# Patient Record
Sex: Male | Born: 1995 | Race: White | Hispanic: No | Marital: Single | Smoking: Never smoker
Health system: Southern US, Community
[De-identification: ages and names within clinical notes are randomized; demographics above are authoritative.]

## PROBLEM LIST (undated history)

## (undated) DIAGNOSIS — S43492A Other sprain of left shoulder joint, initial encounter: Secondary | ICD-10-CM

## (undated) DIAGNOSIS — M25312 Other instability, left shoulder: Secondary | ICD-10-CM

## (undated) HISTORY — PX: INGUINAL HERNIA REPAIR: SHX194

---
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
## 2015-01-11 IMAGING — CR DG SHOULDER 2+V*L*
3 series · 3 of 3 positions shown · non-contrast
Comparison: None.

CLINICAL DATA: Left shoulder pain, football injury

LEFT SHOULDER - 2+ VIEW

[view not recorded (1 of 3)]
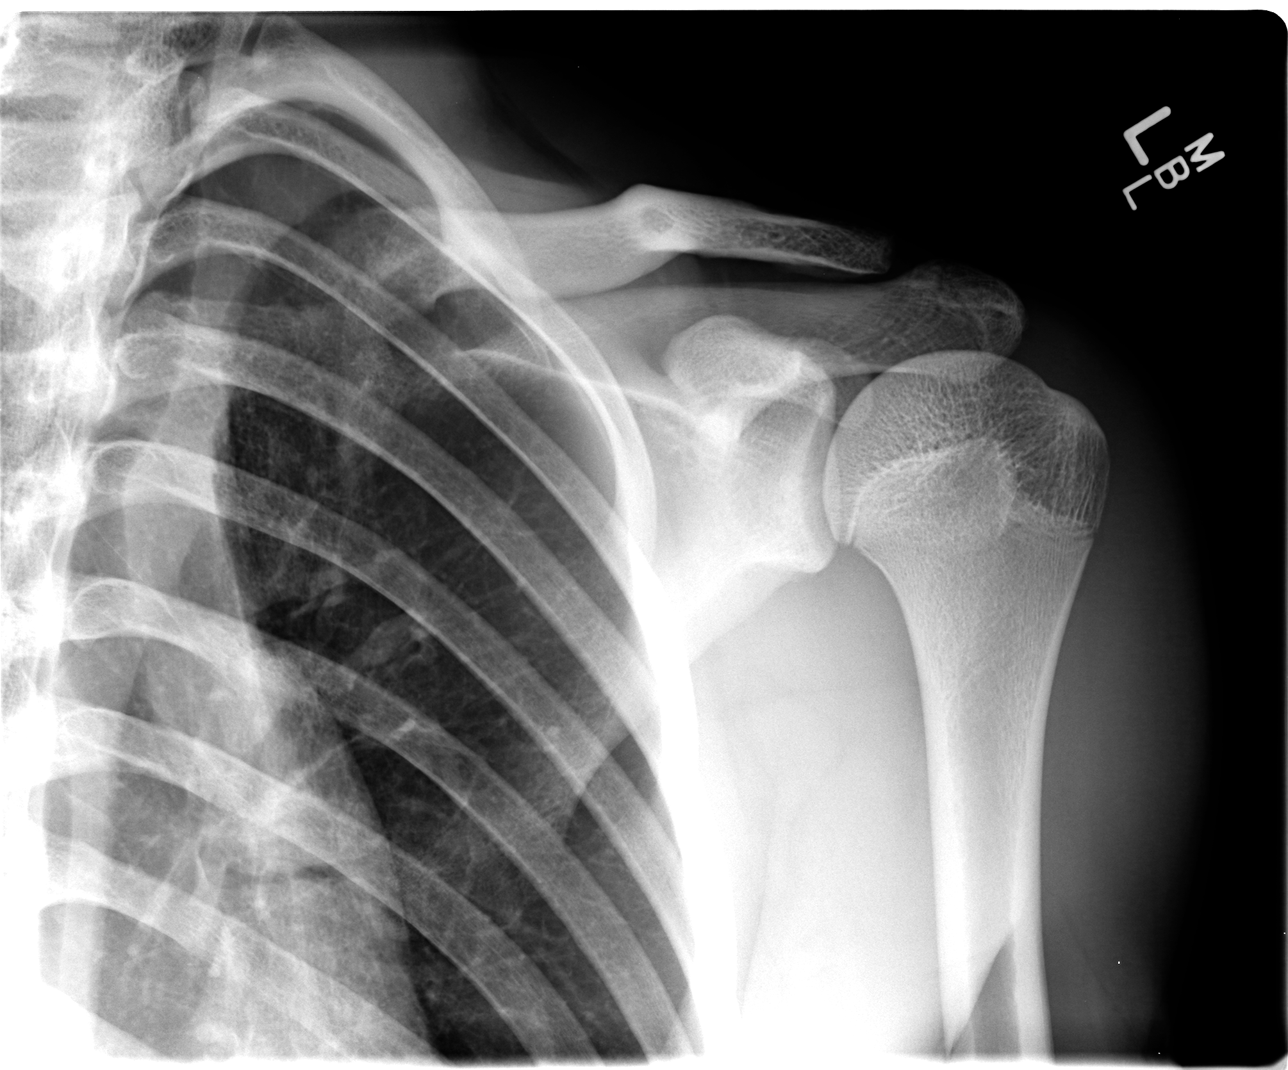

[view not recorded (2 of 3)]
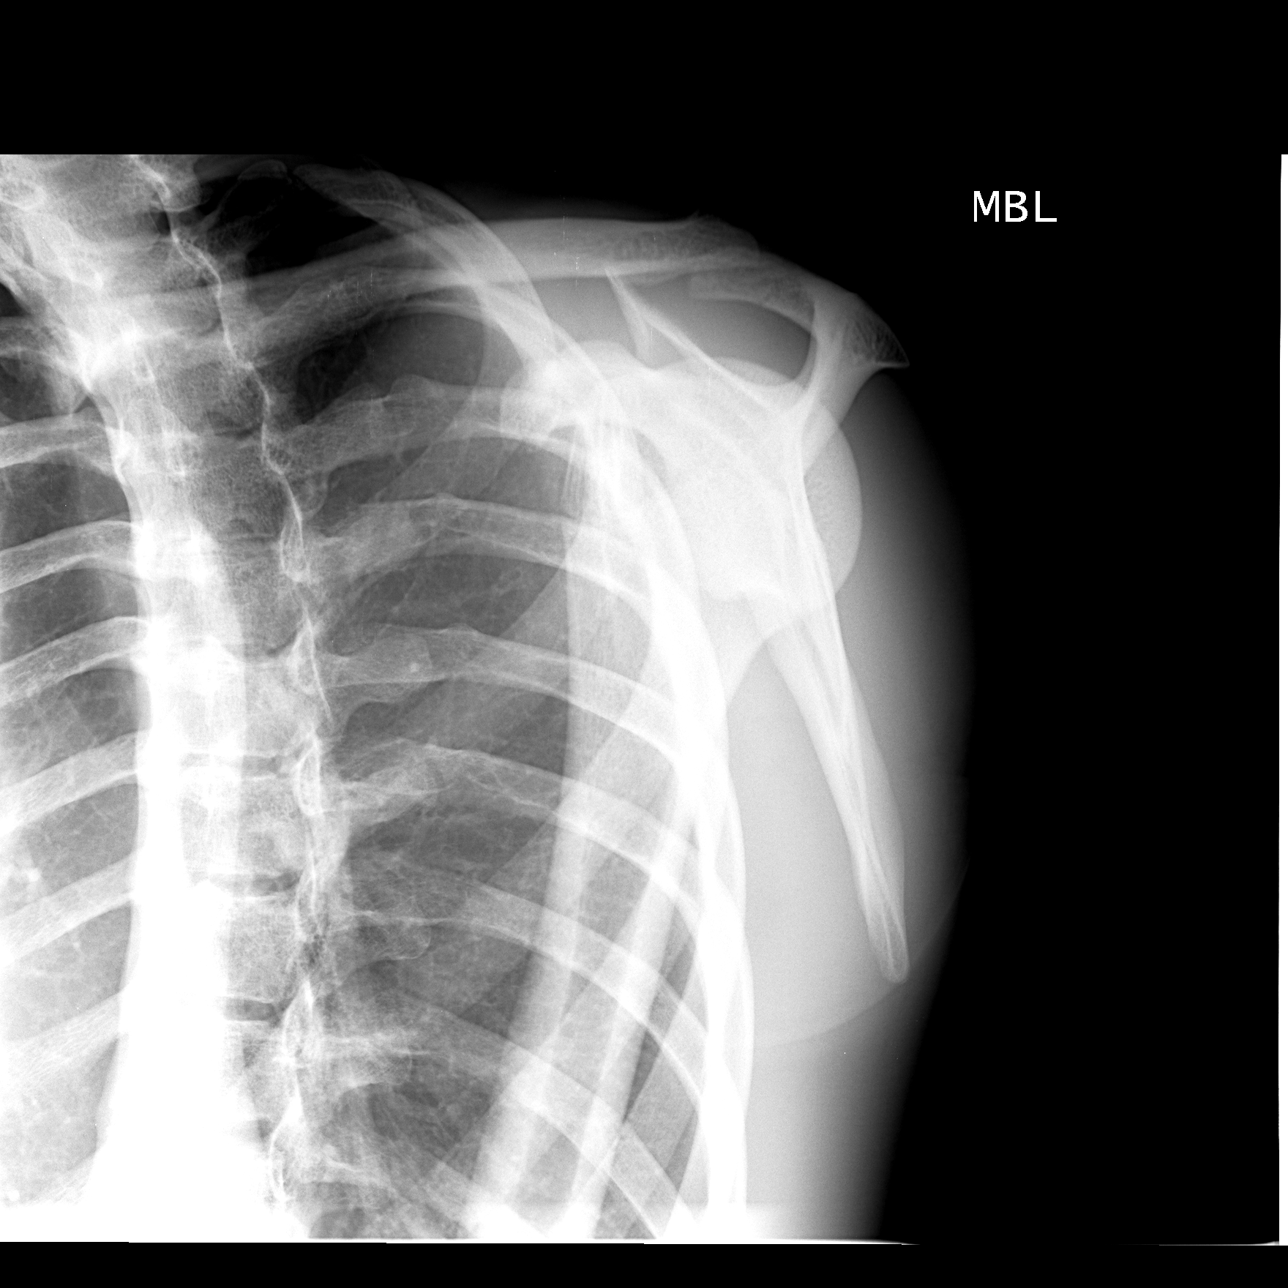

[view not recorded (3 of 3)]
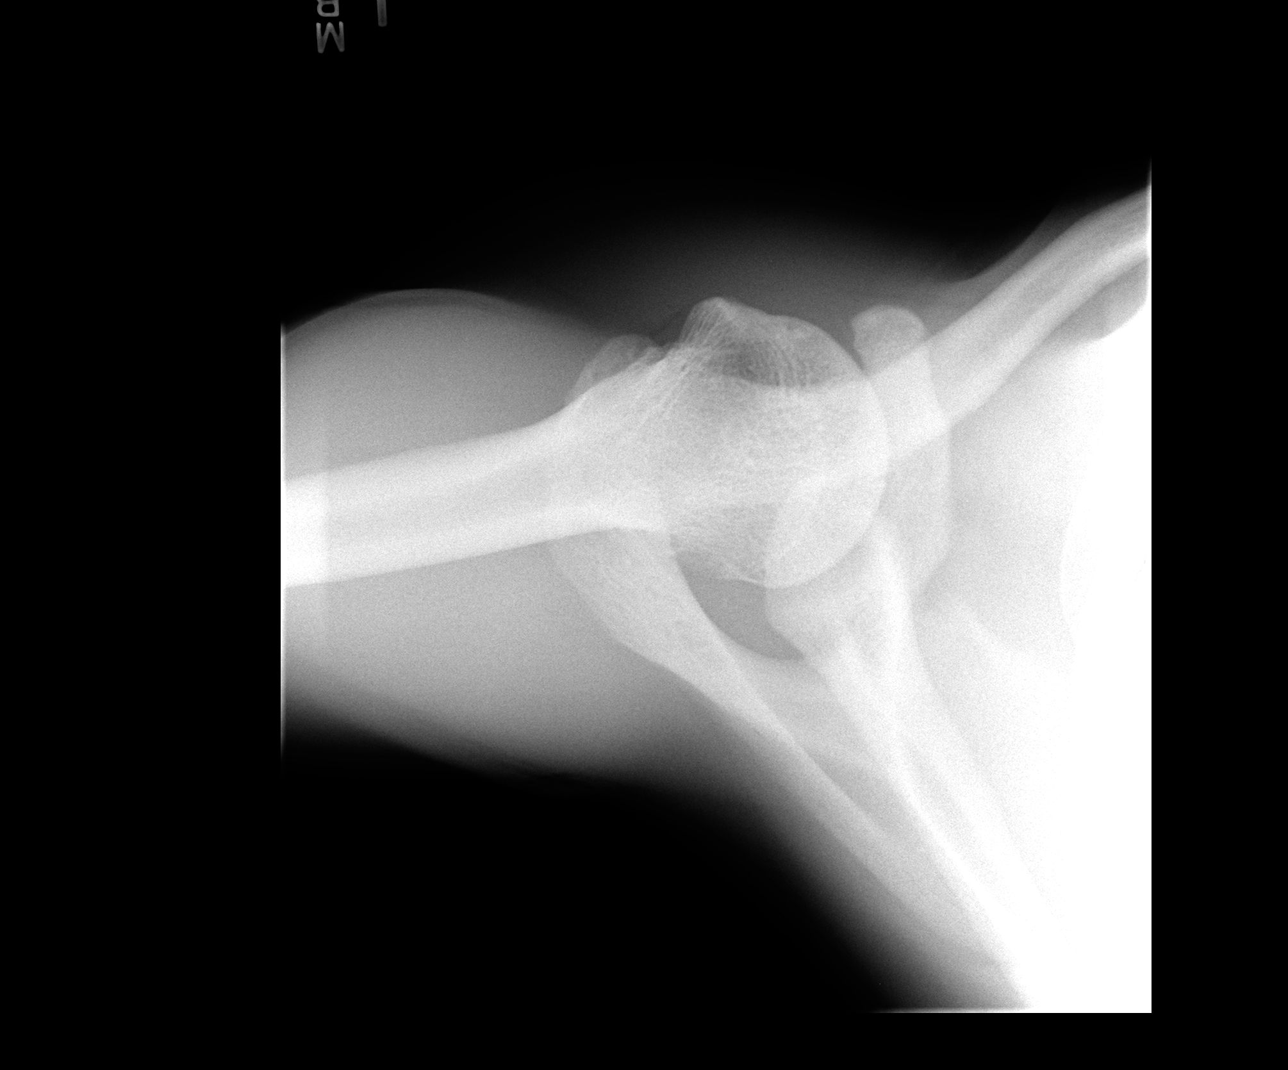

[3 of 3 positions shown; findings below may reference images not displayed]

FINDINGS: No fracture or dislocation is seen.

The joint spaces are preserved.

The visualized soft tissues are unremarkable.

Visualized left lung is clear.
IMPRESSION: No fracture or dislocation is seen.

## 2016-05-01 DIAGNOSIS — M25312 Other instability, left shoulder: Secondary | ICD-10-CM

## 2016-05-01 HISTORY — DX: Other instability, left shoulder: M25.312

## 2016-05-22 ENCOUNTER — Encounter (HOSPITAL_BASED_OUTPATIENT_CLINIC_OR_DEPARTMENT_OTHER): Payer: Self-pay | Admitting: *Deleted

## 2016-05-25 ENCOUNTER — Encounter (HOSPITAL_BASED_OUTPATIENT_CLINIC_OR_DEPARTMENT_OTHER): Payer: Self-pay | Admitting: Physician Assistant

## 2016-05-25 DIAGNOSIS — S43432A Superior glenoid labrum lesion of left shoulder, initial encounter: Secondary | ICD-10-CM

## 2016-05-25 DIAGNOSIS — S43492A Other sprain of left shoulder joint, initial encounter: Secondary | ICD-10-CM

## 2016-05-25 DIAGNOSIS — M25312 Other instability, left shoulder: Secondary | ICD-10-CM

## 2016-05-25 HISTORY — DX: Other sprain of left shoulder joint, initial encounter: S43.492A

## 2016-05-25 HISTORY — DX: Superior glenoid labrum lesion of left shoulder, initial encounter: S43.432A

## 2016-05-25 HISTORY — DX: Other instability, left shoulder: M25.312

## 2016-05-25 NOTE — H&P (Signed)
Jackson Jackson is an 21 y.o. male.   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 He tried to play college football but was unable to do so because of pain and shoulder instability. He also tried to be in the [employer] but due to his shoulder instability was unable to do this as well. He underwent an MRI on 04-25-16 which has revealed a possible labral tear anterior, inferior and posterior. This is not an arthrogram. He continues to have pain with abduction and external rotation. He can lift weights as long as his arms are in at his side. He has tried rehab without significant relief.    Past Medical History:  Diagnosis Date  . 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 Shoulder instability, left 05/2016    Past Surgical History:  Procedure Laterality Date  . INGUINAL HERNIA REPAIR    . TONSILLECTOMY AND ADENOIDECTOMY  08/16/2007    History reviewed. No pertinent family history. Social History:  reports that he has never smoked. He has never used smokeless tobacco. He reports that he does not drink alcohol or use drugs.  Allergies:  Allergies  Allergen Reactions  . Vicodin [Hydrocodone-Acetaminophen] Nausea And Vomiting    No current facility-administered medications for this encounter.  No current outpatient prescriptions on file.  No results found for this or any previous visit (from the past 48 hour(s)). No results found.  Review of Systems  Constitutional: Negative.   HENT: Negative.   Eyes: Negative.   Respiratory: Negative.   Cardiovascular: Negative.   Gastrointestinal: Negative.   Genitourinary: Negative.   Musculoskeletal: Positive for joint pain.  Skin: Negative.   Neurological: Negative.   Endo/Heme/Allergies: Negative.   Psychiatric/Behavioral: Negative.     Height 6\' 2"  (1.88  m), weight 90.7 kg (200 lb). Physical Exam  Constitutional: He is oriented to person, place, and time. He appears well-developed and well-nourished.  HENT:  Head: Normocephalic and atraumatic.  Mouth/Throat: Oropharynx is clear and moist.  Eyes: Conjunctivae are normal. Pupils are equal, round, and reactive to light.  Neck: Neck supple.  Cardiovascular: Normal rate.   Respiratory: Effort normal.  GI: Soft.  Musculoskeletal:  Examination of his left shoulder reveals a full range of motion with pain. There is pain on abduction and external rotation with positive apprehension. Rotator cuff strength is intact. Examination of his right shoulder reveals a full range of motion without pain, weakness or instability. Vascular exam: pulses 2+ and symmetric. Neurologic exam, distal motor and sensory examinations within normal limits.   Neurological: He is alert and oriented to person, place, and time.  Skin: Skin is warm and dry.  Psychiatric: He has a normal mood and affect. His behavior is normal.     Assessment Principal Problem:   Bankart lesion of left shoulder Active Problems:   Instability of left shoulder joint    Plan I talked to him about this in detail. We would recommend with the findings of a significant recurring instability and his lack of response to conservative care that we proceed with a left shoulder arthroscopy with labral debridement versus repair with Bankart repair. Possible anterior and posterior repair. The risks, complications and benefits of the surgery have been described to him in detail and he understands this completely . We will plan on  setting him up for this  at some point in the near future.   Pascal LuxSHEPPERSON,Caliegh Middlekauff J, PA-C 05/25/2016, 3:03 PM

## 2016-05-28 ENCOUNTER — Ambulatory Visit (HOSPITAL_BASED_OUTPATIENT_CLINIC_OR_DEPARTMENT_OTHER)
Admission: RE | Admit: 2016-05-28 | Payer: Managed Care, Other (non HMO) | Source: Ambulatory Visit | Admitting: Orthopedic Surgery

## 2016-05-28 HISTORY — DX: Other sprain of left shoulder joint, initial encounter: S43.492A

## 2016-05-28 HISTORY — DX: Other instability, left shoulder: M25.312

## 2016-05-28 SURGERY — SHOULDER ARTHROSCOPY WITH BANKART REPAIR
Anesthesia: General | Laterality: Left

## 2016-07-03 ENCOUNTER — Encounter (HOSPITAL_BASED_OUTPATIENT_CLINIC_OR_DEPARTMENT_OTHER): Payer: Self-pay | Admitting: *Deleted

## 2016-07-03 NOTE — H&P (Signed)
Jackson Jackson is an 21 y.o. male.   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 He tried to play college football but was unable to do so because of pain and shoulder instability. He also tried to be in the [employer] but due to his shoulder instability was unable to do this as well. He underwent an MRI on 04-25-16 which has revealed a possible labral tear anterior, inferior and posterior. This is not an arthrogram. He continues to have pain with abduction and external rotation. He can lift weights as long as his arms are in at his side. He has tried rehab without significant relief.    Past Medical History:  Diagnosis Date  . 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 Shoulder instability, left 05/2016    Past Surgical History:  Procedure Laterality Date  . INGUINAL HERNIA REPAIR    . TONSILLECTOMY AND ADENOIDECTOMY  08/16/2007    History reviewed. No pertinent family history. Social History:  reports that he has never smoked. He has never used smokeless tobacco. He reports that he does not drink alcohol or use drugs.  Allergies:  Allergies  Allergen Reactions  . Vicodin [Hydrocodone-Acetaminophen] Nausea And Vomiting    No current facility-administered medications for this encounter.   Current Outpatient Prescriptions:  .  DiphenhydrAMINE HCl (BENADRYL ALLERGY PO), Take by mouth., Disp: , Rfl:   No results found for this or any previous visit (from the past 48 hour(s)). No results found.  Review of Systems  Constitutional: Negative.   HENT: Negative.   Eyes: Negative.   Respiratory: Negative.   Cardiovascular: Negative.   Gastrointestinal: Negative.   Genitourinary: Negative.   Musculoskeletal: Positive for joint pain.       Left shoulder pain and instability  Skin: Negative.    Neurological: Negative.   Endo/Heme/Allergies: Negative.   Psychiatric/Behavioral: Negative.     Blood pressure 118/75, pulse 67, height 6\' 2"  (1.88 m), weight 90.7 kg (200 lb). Physical Exam  Constitutional: He is oriented to person, place, and time. He appears well-developed and well-nourished.  HENT:  Head: Normocephalic and atraumatic.  Eyes: Pupils are equal, round, and reactive to light.  Neck: Neck supple.  Cardiovascular: Normal rate.   Respiratory: Effort normal.  GI: Soft.  Genitourinary:  Genitourinary Comments: Not pertinent to current symptomatology therefore not examined.  Musculoskeletal:  Examination of his left shoulder reveals a full range of motion with pain. There is pain on abduction and external rotation with positive apprehension. Rotator cuff strength is intact. Examination of his right shoulder reveals a full range of motion without pain, weakness or instability. Vascular exam: pulses 2+ and symmetric. Neurologic exam, distal motor and sensory examinations within normal limits.   Neurological: He is alert and oriented to person, place, and time.  Skin: Skin is warm and dry.  Psychiatric: He has a normal mood and affect. His behavior is normal.     Assessment Principal Problem:   Bankart lesion of left shoulder Active Problems:   Instability of left shoulder joint   Plan I talked to him about this in detail. We would recommend with the findings of a significant recurring instability and his lack of response to conservative care that we proceed with a left shoulder arthroscopy with labral debridement versus repair with Bankart repair. Possible anterior and posterior repair.  The risks, complications and benefits of the surgery have been described to him in detail and he understands this completely . We will plan on  setting him up for this at some point in the near future.   Pascal LuxSHEPPERSON,Kadence Mikkelson J, PA-C 07/03/2016, 3:28 PM

## 2016-07-08 ENCOUNTER — Ambulatory Visit (HOSPITAL_BASED_OUTPATIENT_CLINIC_OR_DEPARTMENT_OTHER): Payer: Managed Care, Other (non HMO) | Admitting: Anesthesiology

## 2016-07-08 ENCOUNTER — Encounter (HOSPITAL_BASED_OUTPATIENT_CLINIC_OR_DEPARTMENT_OTHER)
Admission: RE | Disposition: A | Discharge status: Home / Self Care | Payer: Self-pay | Source: Ambulatory Visit | Attending: Orthopedic Surgery

## 2016-07-08 ENCOUNTER — Encounter (HOSPITAL_BASED_OUTPATIENT_CLINIC_OR_DEPARTMENT_OTHER): Payer: Self-pay | Admitting: *Deleted

## 2016-07-08 ENCOUNTER — Ambulatory Visit (HOSPITAL_BASED_OUTPATIENT_CLINIC_OR_DEPARTMENT_OTHER)
Admission: RE | Admit: 2016-07-08 | Discharge: 2016-07-08 | Disposition: A | Discharge status: Home / Self Care | Payer: Managed Care, Other (non HMO) | Source: Ambulatory Visit | Attending: Orthopedic Surgery | Admitting: Orthopedic Surgery

## 2016-07-08 DIAGNOSIS — M25312 Other instability, left shoulder: Secondary | ICD-10-CM | POA: Diagnosis not present

## 2016-07-08 DIAGNOSIS — Z885 Allergy status to narcotic agent status: Secondary | ICD-10-CM | POA: Insufficient documentation

## 2016-07-08 DIAGNOSIS — M75112 Incomplete rotator cuff tear or rupture of left shoulder, not specified as traumatic: Secondary | ICD-10-CM | POA: Insufficient documentation

## 2016-07-08 DIAGNOSIS — Y9361 Activity, american tackle football: Secondary | ICD-10-CM | POA: Insufficient documentation

## 2016-07-08 DIAGNOSIS — S43492A Other sprain of left shoulder joint, initial encounter: Secondary | ICD-10-CM

## 2016-07-08 HISTORY — PX: SHOULDER ARTHROSCOPY WITH BANKART REPAIR: SHX5673

## 2016-07-08 SURGERY — SHOULDER ARTHROSCOPY WITH BANKART REPAIR
Anesthesia: General | Site: Shoulder | Laterality: Left

## 2016-07-08 MED ORDER — PROMETHAZINE HCL 12.5 MG PO TABS: 12.5000 mg | ORAL_TABLET | Freq: Four times a day (QID) | ORAL | 0 refills | Status: DC | PRN

## 2016-07-08 MED ORDER — CEFAZOLIN SODIUM-DEXTROSE 2-4 GM/100ML-% IV SOLN
2.0000 g | INTRAVENOUS | Status: AC
  Administered 2016-07-08: 2 g via INTRAVENOUS

## 2016-07-08 MED ORDER — FENTANYL CITRATE (PF) 100 MCG/2ML IJ SOLN
INTRAMUSCULAR | Status: AC
  Filled 2016-07-08: qty 2

## 2016-07-08 MED ORDER — LIDOCAINE 2% (20 MG/ML) 5 ML SYRINGE
INTRAMUSCULAR | Status: AC
  Filled 2016-07-08: qty 5

## 2016-07-08 MED ORDER — BSS IO SOLN
INTRAOCULAR | Status: AC
  Filled 2016-07-08: qty 15

## 2016-07-08 MED ORDER — BUPIVACAINE-EPINEPHRINE (PF) 0.5% -1:200000 IJ SOLN
INTRAMUSCULAR | Status: DC | PRN
  Administered 2016-07-08: 30 mL via PERINEURAL

## 2016-07-08 MED ORDER — SUGAMMADEX SODIUM 200 MG/2ML IV SOLN
INTRAVENOUS | Status: AC
  Filled 2016-07-08: qty 2

## 2016-07-08 MED ORDER — ROCURONIUM BROMIDE 10 MG/ML (PF) SYRINGE
PREFILLED_SYRINGE | INTRAVENOUS | Status: AC
  Filled 2016-07-08: qty 5

## 2016-07-08 MED ORDER — METOCLOPRAMIDE HCL 5 MG/ML IJ SOLN: 10.0000 mg | Freq: Once | INTRAMUSCULAR | Status: DC | PRN

## 2016-07-08 MED ORDER — GLYCOPYRROLATE 0.2 MG/ML IJ SOLN
INTRAMUSCULAR | Status: DC | PRN
  Administered 2016-07-08: 0.2 mg via INTRAVENOUS

## 2016-07-08 MED ORDER — FENTANYL CITRATE (PF) 100 MCG/2ML IJ SOLN
INTRAMUSCULAR | Status: DC | PRN
  Administered 2016-07-08: 100 ug via INTRAVENOUS

## 2016-07-08 MED ORDER — PROPOFOL 10 MG/ML IV BOLUS
INTRAVENOUS | Status: AC
  Filled 2016-07-08: qty 20

## 2016-07-08 MED ORDER — ROCURONIUM BROMIDE 100 MG/10ML IV SOLN
INTRAVENOUS | Status: DC | PRN
  Administered 2016-07-08: 50 mg via INTRAVENOUS

## 2016-07-08 MED ORDER — CEFAZOLIN SODIUM-DEXTROSE 2-4 GM/100ML-% IV SOLN
INTRAVENOUS | Status: AC
  Filled 2016-07-08: qty 100

## 2016-07-08 MED ORDER — CYCLOBENZAPRINE HCL 5 MG PO TABS: 5.0000 mg | ORAL_TABLET | Freq: Three times a day (TID) | ORAL | 0 refills | Status: DC | PRN

## 2016-07-08 MED ORDER — CHLORHEXIDINE GLUCONATE 4 % EX LIQD: 60.0000 mL | Freq: Once | CUTANEOUS | Status: DC

## 2016-07-08 MED ORDER — MIDAZOLAM HCL 2 MG/2ML IJ SOLN
INTRAMUSCULAR | Status: AC
  Filled 2016-07-08: qty 2

## 2016-07-08 MED ORDER — LACTATED RINGERS IV SOLN
INTRAVENOUS | Status: DC
  Administered 2016-07-08: 11:00:00 via INTRAVENOUS

## 2016-07-08 MED ORDER — DEXAMETHASONE SODIUM PHOSPHATE 4 MG/ML IJ SOLN
INTRAMUSCULAR | Status: DC | PRN
  Administered 2016-07-08: 8 mg via INTRAVENOUS

## 2016-07-08 MED ORDER — SUGAMMADEX SODIUM 200 MG/2ML IV SOLN
INTRAVENOUS | Status: DC | PRN
  Administered 2016-07-08: 177.6 mg via INTRAVENOUS

## 2016-07-08 MED ORDER — LACTATED RINGERS IV SOLN: INTRAVENOUS | Status: DC

## 2016-07-08 MED ORDER — FENTANYL CITRATE (PF) 100 MCG/2ML IJ SOLN
25.0000 ug | INTRAMUSCULAR | Status: DC | PRN
  Administered 2016-07-08 (×2): 50 ug via INTRAVENOUS

## 2016-07-08 MED ORDER — FENTANYL CITRATE (PF) 100 MCG/2ML IJ SOLN
50.0000 ug | INTRAMUSCULAR | Status: DC | PRN
  Administered 2016-07-08: 100 ug via INTRAVENOUS

## 2016-07-08 MED ORDER — HYDROMORPHONE HCL 2 MG PO TABS: ORAL_TABLET | ORAL | 0 refills | Status: DC

## 2016-07-08 MED ORDER — DEXAMETHASONE SODIUM PHOSPHATE 10 MG/ML IJ SOLN
INTRAMUSCULAR | Status: AC
  Filled 2016-07-08: qty 1

## 2016-07-08 MED ORDER — POVIDONE-IODINE 7.5 % EX SOLN: Freq: Once | CUTANEOUS | Status: DC

## 2016-07-08 MED ORDER — EPINEPHRINE PF 1 MG/ML IJ SOLN
INTRAMUSCULAR | Status: DC | PRN
  Administered 2016-07-08: 10000 mL

## 2016-07-08 MED ORDER — MIDAZOLAM HCL 2 MG/2ML IJ SOLN
1.0000 mg | INTRAMUSCULAR | Status: DC | PRN
  Administered 2016-07-08: 2 mg via INTRAVENOUS

## 2016-07-08 MED ORDER — MEPERIDINE HCL 25 MG/ML IJ SOLN: 6.2500 mg | INTRAMUSCULAR | Status: DC | PRN

## 2016-07-08 MED ORDER — ONDANSETRON HCL 4 MG/2ML IJ SOLN
INTRAMUSCULAR | Status: DC | PRN
  Administered 2016-07-08: 4 mg via INTRAVENOUS

## 2016-07-08 MED ORDER — ONDANSETRON HCL 4 MG/2ML IJ SOLN
INTRAMUSCULAR | Status: AC
  Filled 2016-07-08: qty 2

## 2016-07-08 MED ORDER — SCOPOLAMINE 1 MG/3DAYS TD PT72: 1.0000 | MEDICATED_PATCH | Freq: Once | TRANSDERMAL | Status: DC | PRN

## 2016-07-08 MED ORDER — PROPOFOL 10 MG/ML IV BOLUS
INTRAVENOUS | Status: DC | PRN
  Administered 2016-07-08: 200 mg via INTRAVENOUS

## 2016-07-08 MED ORDER — LACTATED RINGERS IV SOLN
INTRAVENOUS | Status: DC
  Administered 2016-07-08 (×2): via INTRAVENOUS

## 2016-07-08 MED ORDER — LIDOCAINE HCL (CARDIAC) 20 MG/ML IV SOLN
INTRAVENOUS | Status: DC | PRN
Start: 2016-07-08 — End: 2016-07-08
  Administered 2016-07-08: 50 mg via INTRAVENOUS

## 2016-07-08 SURGICAL SUPPLY — 75 items
ANCHOR SUT BIOCOMP LK 2.9X12.5 (Anchor) ×6 IMPLANT
BENZOIN TINCTURE PRP APPL 2/3 (GAUZE/BANDAGES/DRESSINGS) IMPLANT
BLADE CLIPPER SURG (BLADE) IMPLANT
BLADE CUDA 5.5 (BLADE) IMPLANT
BLADE CUTTER GATOR 3.5 (BLADE) ×2 IMPLANT
BLADE GREAT WHITE 4.2 (BLADE) IMPLANT
BLADE SURG 15 STRL LF DISP TIS (BLADE) IMPLANT
BLADE SURG 15 STRL SS (BLADE)
BUR OVAL 6.0 (BURR) IMPLANT
CANNULA 5.75X71 LONG (CANNULA) ×2 IMPLANT
CANNULA TWIST IN 8.25X7CM (CANNULA) ×4 IMPLANT
DECANTER SPIKE VIAL GLASS SM (MISCELLANEOUS) IMPLANT
DRAPE SHOULDER BEACH CHAIR (DRAPES) ×2 IMPLANT
DRAPE U-SHAPE 47X51 STRL (DRAPES) ×4 IMPLANT
DRSG PAD ABDOMINAL 8X10 ST (GAUZE/BANDAGES/DRESSINGS) ×2 IMPLANT
DURAPREP 26ML APPLICATOR (WOUND CARE) ×2 IMPLANT
ELECT REM PT RETURN 9FT ADLT (ELECTROSURGICAL)
ELECTRODE REM PT RTRN 9FT ADLT (ELECTROSURGICAL) IMPLANT
GAUZE SPONGE 4X4 12PLY STRL (GAUZE/BANDAGES/DRESSINGS) ×2 IMPLANT
GAUZE XEROFORM 1X8 LF (GAUZE/BANDAGES/DRESSINGS) ×2 IMPLANT
GLOVE BIO SURGEON STRL SZ 6.5 (GLOVE) ×2 IMPLANT
GLOVE BIO SURGEON STRL SZ7 (GLOVE) ×4 IMPLANT
GLOVE BIOGEL PI IND STRL 7.0 (GLOVE) ×6 IMPLANT
GLOVE BIOGEL PI IND STRL 7.5 (GLOVE) ×2 IMPLANT
GLOVE BIOGEL PI INDICATOR 7.0 (GLOVE) ×6
GLOVE BIOGEL PI INDICATOR 7.5 (GLOVE) ×2
GLOVE SS BIOGEL STRL SZ 7.5 (GLOVE) ×1 IMPLANT
GLOVE SUPERSENSE BIOGEL SZ 7.5 (GLOVE) ×1
GOWN STRL REUS W/ TWL LRG LVL3 (GOWN DISPOSABLE) ×3 IMPLANT
GOWN STRL REUS W/ TWL XL LVL3 (GOWN DISPOSABLE) ×2 IMPLANT
GOWN STRL REUS W/TWL LRG LVL3 (GOWN DISPOSABLE) ×3
GOWN STRL REUS W/TWL XL LVL3 (GOWN DISPOSABLE) ×2
IV NS IRRIG 3000ML ARTHROMATIC (IV SOLUTION) ×8 IMPLANT
KIT PUSHLOCK 2.9 HIP (KITS) ×2 IMPLANT
LASSO 90 CVE QUICKPAS (DISPOSABLE) ×2 IMPLANT
LASSO CRESCENT QUICKPASS (SUTURE) ×2 IMPLANT
LOOP 2 FIBERLINK CLOSED (SUTURE) IMPLANT
MANIFOLD NEPTUNE II (INSTRUMENTS) ×2 IMPLANT
NDL SAFETY ECLIPSE 18X1.5 (NEEDLE) ×1 IMPLANT
NEEDLE HYPO 18GX1.5 SHARP (NEEDLE) ×1
PACK ARTHROSCOPY DSU (CUSTOM PROCEDURE TRAY) ×2 IMPLANT
PACK BASIN DAY SURGERY FS (CUSTOM PROCEDURE TRAY) ×2 IMPLANT
PENCIL BUTTON HOLSTER BLD 10FT (ELECTRODE) IMPLANT
SET ARTHROSCOPY TUBING (MISCELLANEOUS) ×1
SET ARTHROSCOPY TUBING LN (MISCELLANEOUS) ×1 IMPLANT
SHEET MEDIUM DRAPE 40X70 STRL (DRAPES) IMPLANT
SLEEVE SCD COMPRESS KNEE MED (MISCELLANEOUS) ×2 IMPLANT
SLING ARM FOAM STRAP LRG (SOFTGOODS) IMPLANT
SLING ARM IMMOBILIZER MED (SOFTGOODS) IMPLANT
SLING ARM MED ADULT FOAM STRAP (SOFTGOODS) IMPLANT
SLING ARM XL FOAM STRAP (SOFTGOODS) IMPLANT
SLING ULTRA III MED (ORTHOPEDIC SUPPLIES) IMPLANT
SPONGE LAP 4X18 X RAY DECT (DISPOSABLE) IMPLANT
STRIP CLOSURE SKIN 1/2X4 (GAUZE/BANDAGES/DRESSINGS) IMPLANT
SUCTION FRAZIER HANDLE 10FR (MISCELLANEOUS)
SUCTION TUBE FRAZIER 10FR DISP (MISCELLANEOUS) IMPLANT
SUT ETHILON 3 0 PS 1 (SUTURE) ×2 IMPLANT
SUT FIBERWIRE #2 38 T-5 BLUE (SUTURE)
SUT PDS AB 2-0 CT2 27 (SUTURE) IMPLANT
SUT PROLENE 3 0 PS 2 (SUTURE) IMPLANT
SUT VIC AB 0 SH 27 (SUTURE) IMPLANT
SUT VIC AB 2-0 PS2 27 (SUTURE) IMPLANT
SUT VIC AB 2-0 SH 27 (SUTURE)
SUT VIC AB 2-0 SH 27XBRD (SUTURE) IMPLANT
SUTURE FIBERWR #2 38 T-5 BLUE (SUTURE) IMPLANT
SYR 5ML LL (SYRINGE) ×2 IMPLANT
SYR BULB 3OZ (MISCELLANEOUS) IMPLANT
TAPE HYPAFIX 6X30 (GAUZE/BANDAGES/DRESSINGS) IMPLANT
TAPE LABRALWHITE 1.5X36 (TAPE) ×2 IMPLANT
TAPE STRIPS DRAPE STRL (GAUZE/BANDAGES/DRESSINGS) ×2 IMPLANT
TAPE SUT LABRALTAP WHT/BLK (SUTURE) ×4 IMPLANT
TOWEL OR 17X24 6PK STRL BLUE (TOWEL DISPOSABLE) ×2 IMPLANT
TUBE CONNECTING 20X1/4 (TUBING) IMPLANT
WAND STAR VAC 90 (SURGICAL WAND) IMPLANT
WATER STERILE IRR 1000ML POUR (IV SOLUTION) ×2 IMPLANT

## 2016-07-08 NOTE — Anesthesia Preprocedure Evaluation (Signed)
Anesthesia Evaluation  Patient identified by MRN, date of birth, ID band Patient awake    Reviewed: Allergy & Precautions, NPO status , Patient's Chart, lab work & pertinent test results  Airway Mallampati: II  TM Distance: >3 FB Neck ROM: Full    Dental no notable dental hx.    Pulmonary neg pulmonary ROS,    Pulmonary exam normal breath sounds clear to auscultation       Cardiovascular negative cardio ROS Normal cardiovascular exam Rhythm:Regular Rate:Normal     Neuro/Psych negative neurological ROS  negative psych ROS   GI/Hepatic negative GI ROS, Neg liver ROS,   Endo/Other  negative endocrine ROS  Renal/GU negative Renal ROS  negative genitourinary   Musculoskeletal negative musculoskeletal ROS (+)   Abdominal   Peds negative pediatric ROS (+)  Hematology negative hematology ROS (+)   Anesthesia Other Findings   Reproductive/Obstetrics negative OB ROS                             Anesthesia Physical Anesthesia Plan  ASA: II  Anesthesia Plan: General   Post-op Pain Management:  Regional for Post-op pain   Induction: Intravenous  Airway Management Planned:   Additional Equipment:   Intra-op Plan:   Post-operative Plan: Extubation in OR  Informed Consent: I have reviewed the patients History and Physical, chart, labs and discussed the procedure including the risks, benefits and alternatives for the proposed anesthesia with the patient or authorized representative who has indicated his/her understanding and acceptance.   Dental advisory given  Plan Discussed with: CRNA  Anesthesia Plan Comments: (SCB)        Anesthesia Quick Evaluation

## 2016-07-08 NOTE — Discharge Instructions (Signed)

## 2016-07-08 NOTE — Anesthesia Procedure Notes (Signed)
Anesthesia Regional Block: Supraclavicular block   Pre-Anesthetic Checklist: ,, timeout performed, Correct Patient, Correct Site, Correct Laterality, Correct Procedure, Correct Position, site marked, Risks and benefits discussed,  Surgical consent,  Pre-op evaluation,  At surgeon's request and post-op pain management  Laterality: Left and Upper  Prep: Maximum Sterile Barrier Precautions used, chloraprep       Needles:  Injection technique: Single-shot  Needle Type: Echogenic Stimulator Needle     Needle Length: 10cm      Additional Needles:   Procedures: ultrasound guided,,,,,,,,  Narrative:  Start time: 07/08/2016 11:13 AM End time: 07/08/2016 11:23 AM Injection made incrementally with aspirations every 5 mL.  Performed by: Personally  Anesthesiologist: Phillips GroutARIGNAN, [place] Downs  Additional Notes: Risks, benefits and alternative to block explained extensively.  Patient tolerated procedure well, without complications.

## 2016-07-08 NOTE — Interval H&P Note (Signed)
History and Physical Interval Note:  07/08/2016 11:53 AM  Jackson Jackson  has presented today for surgery, with the diagnosis of LEFT SHOULDER RECURRENT DISLOCATION   The various methods of treatment have been discussed with the patient and family. After consideration of risks, benefits and other options for treatment, the patient has consented to  Procedure(s): LEFT SHOULDER ARTHROSCOPY WITH BANKART REPAIR (Left) as a surgical intervention .  The patient's history has been reviewed, patient examined, no change in status, stable for surgery.  I have reviewed the patient's chart and labs.  Questions were answered to the patient's satisfaction.     Salvatore MarvelWAINER,Yuriel Lopezmartinez A

## 2016-07-08 NOTE — Transfer of Care (Signed)
Immediate Anesthesia Transfer of Care Note  Patient: Jackson Jackson  Procedure(s) Performed: Procedure(s): LEFT SHOULDER ARTHROSCOPY WITH BANKART REPAIR (Left)  Patient Location: PACU  Anesthesia Type:General  Level of Consciousness: awake, alert  and oriented  Airway & Oxygen Therapy: Patient Spontanous Breathing and Patient connected to face mask oxygen  Post-op Assessment: Report given to RN and Post -op Vital signs reviewed and stable  Post vital signs: Reviewed and stable  Last Vitals:  Vitals:   07/08/16 1112 07/08/16 1115  BP:  98/78  Pulse: 66 79  Resp: 18 (!) 22  Temp:      Last Pain:  Vitals:   07/08/16 1106  TempSrc:   PainSc: 0-No pain         Complications: No apparent anesthesia complications

## 2016-07-08 NOTE — Progress Notes (Signed)
Assisted Dr. Carignan with left, ultrasound guided, supraclavicular block. Side rails up, monitors on throughout procedure. See vital signs in flow sheet. Tolerated Procedure well. 

## 2016-07-08 NOTE — Op Note (Signed)
NAME:  Jackson Jackson, Jackson Jackson                   ACCOUNT NO.:  MEDICAL RECORD NO.:  [id number]  LOCATION:                                 FACILITY:  PHYSICIAN:  Hadleigh Felber A. Thurston HoleWainer, M.D.      DATE OF BIRTH:  DATE OF PROCEDURE:  07/08/2016 DATE OF DISCHARGE:                              OPERATIVE REPORT   PREOPERATIVE DIAGNOSES: 1. 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 2. 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  POSTOPERATIVE DIAGNOSES: 1. 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 2. 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  PROCEDURE: 1. Left shoulder EUA, followed by arthroscopically assisted anterior     and posterior Bankart repairs using Arthrex PushLock anchors x2     anteriorly and x1 posteriorly. 2. Left shoulder partial rotator cuff tear debridement.  SURGEON:  Elana Almobert A. Thurston HoleWainer, M.D.  ASSISTANT:  Julien GirtKirstin Shepperson, PA.  ANESTHESIA:  General.  OPERATIVE TIME:  One hour.  COMPLICATIONS:  None.  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  Exam and MRI have revealed both anterior and posterior Bankart tears with recurrent instability with a partial rotator cuff tear.  He has failed conservative care and is now to undergo arthroscopy and repair.  DESCRIPTION OF PROCEDURE:  Jackson RearDillon was brought to the operating room on Jul 08, 2016, after an interscalene block was placed in the holding room by Anesthesia.  He was placed on the operating table in supine position. He received antibiotics preoperatively for prophylaxis.  After being placed under general anesthesia, his left shoulder was examined.  He had full range of motion with anterior, inferior, and posterior instability on the left shoulder, which he did  not have on the right.  He was then placed in beach chair position and his shoulder and arm were prepped using sterile DuraPrep and draped using sterile technique.  Time-out procedure was called, the correct left shoulder identified.  Initially, through a posterior arthroscopic portal, the arthroscope with a pump attached was placed into an anterior portal and arthroscopic probe was placed.  On initial inspection, the articular cartilage in the glenohumeral joint was intact.  He had a large Bankart tear from the 8 o'clock position anteriorly all the way inferiorly up to the 4 o'clock position posteriorly.  The superior labrum and biceps tendon anchor were intact.  The biceps tendon was intact.  The rotator cuff showed a partial tear of the supraspinatus 25%, which was debrided.  The rest of rotator cuff was intact.  At this point, through an accessory anterior portal, the anterior Bankart portion of the tear was repaired primarily with 2 separate Arthrex PushLock anchors using mattress suture technique, one in the 7 o'clock and one in the 8 o'clock position with firm and tight fixation.  This completely eliminated the anterior and inferior instability and restored the anatomy to normal.  Through an accessory posterior portal, the posterior inferior Bankart was repaired with 1 Arthrex PushLock anchor using a  mattress suture technique in the 5 o'clock position on the posterior inferior glenoid with firm and tight fixation.  This completely eliminated the posterior instability as well. After this was done, the shoulder could be brought through a satisfactory range of motion with excellent stability and excellent restoration of normal anatomy.  At this point, it was felt that all pathology been satisfactorily addressed.  The instruments were removed. Portals closed with 3-0 nylon suture.  Sterile dressings and abduction sling applied, and then, the patient was awakened and taken to  recovery room in stable condition.  Needle, sponge counts correct x2 at the end of the case.  FOLLOWUP CARE:  Jackson Jackson will be followed as an outpatient on oxycodone and Flexeril with an abduction sling.  He will be seen back in office in a week for sutures out and followup.     Jackson Jackson, M.D.     RAW/MEDQ  D:  07/08/2016  T:  07/08/2016  Job:  [id number]

## 2016-07-08 NOTE — Anesthesia Procedure Notes (Signed)
Procedure Name: Intubation Date/Time: 07/08/2016 12:16 PM Performed by: Jackson Jackson, Jackson Jackson Pre-anesthesia Checklist: Patient identified, Emergency Drugs available, Suction available and Patient being monitored Patient Re-evaluated:Patient Re-evaluated prior to inductionOxygen Delivery Method: Circle system utilized Preoxygenation: Pre-oxygenation with 100% oxygen Intubation Type: IV induction Ventilation: Mask ventilation without difficulty Laryngoscope Size: Miller and 2 Grade View: Grade II Tube type: Oral Number of attempts: 1 Airway Equipment and Method: Stylet Placement Confirmation: ETT inserted through vocal cords under direct vision,  positive ETCO2 and CO2 detector Secured at: 22 cm Tube secured with: Tape Dental Injury: Teeth and Oropharynx as per pre-operative assessment

## 2016-07-08 NOTE — Anesthesia Postprocedure Evaluation (Signed)
Anesthesia Post Note  Patient: Jackson Jackson  Procedure(s) Performed: Procedure(s) (LRB): LEFT SHOULDER ARTHROSCOPY WITH BANKART REPAIR (Left)  Patient location during evaluation: PACU Anesthesia Type: General and Regional Level of consciousness: awake and alert Pain management: pain level controlled Vital Signs Assessment: post-procedure vital signs reviewed and stable Respiratory status: spontaneous breathing, nonlabored ventilation, respiratory function stable and patient connected to nasal cannula oxygen Cardiovascular status: blood pressure returned to baseline and stable Postop Assessment: no signs of nausea or vomiting Anesthetic complications: no       Last Vitals:  Vitals:   07/08/16 1427 07/08/16 1428  BP:    Pulse: 71 79  Resp: 12 18  Temp:      Last Pain:  Vitals:   07/08/16 1415  TempSrc:   PainSc: 1                  Phillips Groutarignan, Shenae Bonanno

## 2016-07-09 ENCOUNTER — Encounter (HOSPITAL_BASED_OUTPATIENT_CLINIC_OR_DEPARTMENT_OTHER): Payer: Self-pay | Admitting: Orthopedic Surgery

## 2016-08-25 ENCOUNTER — Encounter (HOSPITAL_COMMUNITY): Payer: Self-pay

## 2016-08-25 ENCOUNTER — Emergency Department (HOSPITAL_COMMUNITY)
Admission: EM | Admit: 2016-08-25 | Discharge: 2016-08-25 | Disposition: A | Discharge status: Home / Self Care | Payer: Managed Care, Other (non HMO) | Attending: Emergency Medicine | Admitting: Emergency Medicine

## 2016-08-25 DIAGNOSIS — Z7689 Persons encountering health services in other specified circumstances: Secondary | ICD-10-CM

## 2016-08-25 DIAGNOSIS — Z113 Encounter for screening for infections with a predominantly sexual mode of transmission: Secondary | ICD-10-CM | POA: Insufficient documentation

## 2016-08-25 NOTE — ED Provider Notes (Signed)
[facility]-EMERGENCY DEPT Provider Note   CSN: [id number] Arrival date & time: 08/25/16  1814     History   Chief Complaint No chief complaint on file.   HPI Jackson Jackson is a 21 y.o. male.  The history is provided by the patient.   21 year old male who presents with concern for possible STD exposure. He was called by sexual partner today and told that she had genital herpes. Remainder of her STD testing was negative. He engaged in unprotected sexual intercourse recently, but denies having any penile lesions, penile discharge, or penile pain. Denies any n/v, fever or chills, abdominal pain, flank pain, dysuria or urinary frequency.   Past Medical History:  Diagnosis Date  . 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 Bankart lesion of left shoulder 05/25/2016    Past Surgical History:  Procedure Laterality Date  . INGUINAL HERNIA REPAIR    . SHOULDER ARTHROSCOPY WITH BANKART REPAIR Left 07/08/2016   Procedure: LEFT SHOULDER ARTHROSCOPY WITH BANKART REPAIR;  Surgeon: Salvatore Marvel, MD;  Location: [facility];  Service: Orthopedics;  Laterality: Left;  . TONSILLECTOMY AND ADENOIDECTOMY  08/16/2007       Home Medications    Prior to Admission medications   Medication Sig Start Date End Date Taking? Authorizing Provider  cyclobenzaprine (FLEXERIL) 5 MG tablet Take 1 tablet (5 mg total) by mouth 3 (three) times daily as needed for muscle spasms. 07/08/16   Shepperson, Kirstin, PA-C  DiphenhydrAMINE HCl (BENADRYL ALLERGY PO) Take by mouth.    [provider]  HYDROmorphone (DILAUDID) 2 MG tablet 1-2 tablets every 4-6 hrs as needed for pain 07/08/16   Shepperson, Kirstin, PA-C  promethazine (PHENERGAN) 12.5 MG tablet Take 1 tablet (12.5 mg total) by mouth every 6 (six)  hours as needed for nausea or vomiting. 07/08/16   Shepperson, Kirstin, PA-C    Family History No family history on file.  Social History Social History  Substance Use Topics  . Smoking status: Never Smoker  . Smokeless tobacco: Never Used  . Alcohol use Yes     Comment: occasionally      Allergies   Vicodin [hydrocodone-acetaminophen]   Review of Systems Review of Systems  Constitutional: Negative for fever.  Gastrointestinal: Negative for abdominal pain, nausea and vomiting.  Genitourinary: Negative for discharge, dysuria and penile pain.  Musculoskeletal: Negative for back pain.  Skin: Negative for rash.  Allergic/Immunologic: Negative for immunocompromised state.  Hematological: Does not bruise/bleed easily.     Physical Exam Updated Vital Signs BP (!) 148/95   Pulse 100   Temp 98.9 F (37.2 C) (Oral)   Resp (!) 21   Wt 86.6 kg (190 lb 14.4 oz)   SpO2 100%   BMI 24.51 kg/m   Physical Exam Physical Exam  Constitutional: Appears well-developed and well-nourished. No acute distress. HENT:  Head: Normocephalic.  Eyes: Conjunctivae are normal.  Cardiovascular: Normal rate and intact distal pulses.   Pulmonary/Chest: Effort normal. No respiratory distress.  Abdominal: Exhibits no distension. no tenderness to palpation Genitourinary: normal external genitalia. No penile discharge. No penile lesions or rash. Normal scrotum. Normal testicular lie. No testicular tenderness. Musculoskeletal: Normal range of motion. Exhibits no deformity.  Neurological: Alert. Fluent speech.  Skin: Skin is warm and dry.  Psychiatric: Normal mood  and affect. Behavior is normal.  Nursing note and vitals reviewed.   ED Treatments / Results  Labs (all labs ordered are listed, but only abnormal results are displayed) Labs Reviewed - No data to display  EKG  EKG Interpretation None       Radiology No results found.  Procedures Procedures (including critical care  time)  Medications Ordered in ED Medications - No data to display   Initial Impression / Assessment and Plan / ED Course  I have reviewed the triage vital signs and the nursing notes.  Pertinent labs & imaging results that were available during my care of the patient were reviewed by me and considered in my medical decision making (see chart for details).     Patient without any genital lesions on exam to suggestive HSV. Recommended further STD testing, including GC/Chlamydia, HIV, syphillis but patient refused. States that his partner just tested negative today for the other STDs. Discussed strict return instructions.He expressed understanding of all discharge instructions and felt comfortable with the plan of care.   Final Clinical Impressions(s) / ED Diagnoses   Final diagnoses:  Encounter for assessment of STD exposure    New Prescriptions New Prescriptions   No medications on file     Lavera GuiseLiu, Princella Jaskiewicz Duo, MD 08/25/16 2046

## 2016-08-25 NOTE — Discharge Instructions (Signed)
You do not have genital herpes on exam today.  Please return if you do develop penile pain or lesion, abnormal penile discharge, or any other symptoms concerning to you.

## 2016-08-25 NOTE — ED Triage Notes (Signed)
Per pt: He got a phone call this AM from a girl that "tested positive for something and I need to get checked. It was herpes strain 2 I believe." Pt states that he is not showing any symptoms.  Pt has no other complaints at this time.

## 2016-09-16 ENCOUNTER — Encounter (HOSPITAL_COMMUNITY): Payer: Self-pay

## 2016-09-16 ENCOUNTER — Emergency Department (HOSPITAL_COMMUNITY)
Admission: EM | Admit: 2016-09-16 | Discharge: 2016-09-16 | Disposition: A | Discharge status: Home / Self Care | Payer: Managed Care, Other (non HMO) | Attending: Emergency Medicine | Admitting: Emergency Medicine

## 2016-09-16 DIAGNOSIS — Z202 Contact with and (suspected) exposure to infections with a predominantly sexual mode of transmission: Secondary | ICD-10-CM | POA: Insufficient documentation

## 2016-09-16 NOTE — Discharge Instructions (Signed)
It was my pleasure taking care of you today!  Follow up with your PCP or health department if you develop any symptoms.  Return to ER for as needed.

## 2016-09-16 NOTE — ED Notes (Signed)
States wants blood test for herpes was told by GF that she was positive

## 2016-09-16 NOTE — ED Triage Notes (Signed)
Pt endorses receiving a call on 06/25 by a gf and she was told that she had herpes, pt was checked here after that and had no sx, pt still has no sx but states "i want a blood test done" VSS.

## 2016-09-17 NOTE — ED Provider Notes (Signed)
[facility] Provider Note   CSN: [id number] Arrival date & time: 09/16/16  1253     History   Chief Complaint Chief Complaint  Patient presents with  . Exposure to STD    HPI Jackson MustDillon M Mcvay is a 21 y.o. male.  The history is provided by the patient and medical records. No language interpreter was used.  Exposure to STD  Pertinent negatives include no abdominal pain.   Jackson Jackson is an otherwise healthy 21 y.o. male who presents to ED requesting blood draw to check for herpes. Patient notes that about one month ago he had unprotected intercourse with a male who later had genital herpes outbreak. Patient is asymptomatic. He denies any rashes/lesions, penile discharge, dysuria, urinary urgency/frequency, scrotal tenderness/swelling, fever, chills. 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   Past Medical History:  Diagnosis Date  . 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 Bankart lesion of left shoulder 05/25/2016    Past Surgical History:  Procedure Laterality Date  . INGUINAL HERNIA REPAIR    . SHOULDER ARTHROSCOPY WITH BANKART REPAIR Left 07/08/2016   Procedure: LEFT SHOULDER ARTHROSCOPY WITH BANKART REPAIR;  Surgeon: Salvatore MarvelWainer, Robert, MD;  Location: [facility];  Service: Orthopedics;  Laterality: Left;  . TONSILLECTOMY AND ADENOIDECTOMY  08/16/2007       Home Medications    Prior to Admission medications   Medication Sig Start Date End Date Taking? Authorizing Provider  cyclobenzaprine (FLEXERIL) 5 MG tablet Take 1 tablet (5 mg total) by mouth 3 (three) times daily as needed for muscle spasms. 07/08/16   Shepperson, Kirstin, PA-C  DiphenhydrAMINE HCl (BENADRYL ALLERGY PO) Take by mouth.    [provider]  HYDROmorphone  (DILAUDID) 2 MG tablet 1-2 tablets every 4-6 hrs as needed for pain 07/08/16   Shepperson, Kirstin, PA-C  promethazine (PHENERGAN) 12.5 MG tablet Take 1 tablet (12.5 mg total) by mouth every 6 (six) hours as needed for nausea or vomiting. 07/08/16   Shepperson, Kirstin, PA-C    Family History History reviewed. No pertinent family history.  Social History Social History  Substance Use Topics  . Smoking status: Never Smoker  . Smokeless tobacco: Never Used  . Alcohol use Yes     Comment: occasionally      Allergies   Vicodin [hydrocodone-acetaminophen]   Review of Systems Review of Systems  Constitutional: Negative for chills and fever.  Gastrointestinal: Negative for abdominal pain.  Genitourinary: Negative for discharge, dysuria, frequency, genital sores, penile pain, penile swelling, scrotal swelling, testicular pain and urgency.     Physical Exam Updated Vital Signs BP 131/80 (BP Location: Left Arm)   Pulse 73   Temp 98 F (36.7 C) (Oral)   Resp 16   Ht 6\' 2"  (1.88 m)   Wt 86.2 kg (190 lb)   SpO2 99%   BMI 24.39 kg/m   Physical Exam  Constitutional: He is oriented to person, place, and time. He appears well-developed and well-nourished. No distress.  HENT:  Head: Normocephalic and atraumatic.  Cardiovascular: Normal rate, regular rhythm and normal heart sounds.   No murmur heard. Pulmonary/Chest: Effort normal and breath sounds normal. No respiratory distress.  Abdominal: Soft. He exhibits no distension. There is no tenderness.  Genitourinary:  Genitourinary Comments: Chaperone present  for exam. No lesions or rashes. No discharge from penis. The penis and testicles are nontender. No testicular masses or swelling.  Neurological: He is alert and oriented to person, place, and time.  Skin: Skin is warm and dry.  Nursing note and vitals reviewed.    ED Treatments / Results  Labs (all labs ordered are listed, but only abnormal results are displayed) Labs Reviewed  - No data to display  EKG  EKG Interpretation None       Radiology No results found.  Procedures Procedures (including critical care time)  Medications Ordered in ED Medications - No data to display   Initial Impression / Assessment and Plan / ED Course  I have reviewed the triage vital signs and the nursing notes.  Pertinent labs & imaging results that were available during my care of the patient were reviewed by me and considered in my medical decision making (see chart for details).    Jackson Jackson is a 21 y.o. male who presents to ED for concerns for genital herpes. He had unprotected intercourse with male who later informed him of genital herpes outbreak approximately 1 month ago. He has had no symptoms. No GU lesions/rashes. He is requesting blood work to check for herpes virus. Explained to patient that herpes is a clinical diagnosis. He also has hx of cold sores, therefore expect him to test positive for hsv despite lack of genital herpes symptoms. Patient understands. Informed him to follow up with health department, PCP or return to ER if symptoms develop. All questions answered.    Final Clinical Impressions(s) / ED Diagnoses   Final diagnoses:  STD exposure    New Prescriptions Discharge Medication List as of 09/16/2016  2:14 PM       Roshunda Keir, Chase Picket, PA-C 09/17/16 1021    Alvira Monday, MD 09/17/16 1401

## 2016-10-20 ENCOUNTER — Ambulatory Visit (HOSPITAL_COMMUNITY)
Admission: EM | Admit: 2016-10-20 | Discharge: 2016-10-20 | Disposition: A | Discharge status: Home / Self Care | Payer: Managed Care, Other (non HMO) | Attending: Emergency Medicine | Admitting: Emergency Medicine

## 2016-10-20 ENCOUNTER — Encounter (HOSPITAL_COMMUNITY): Payer: Self-pay | Admitting: *Deleted

## 2016-10-20 DIAGNOSIS — B356 Tinea cruris: Secondary | ICD-10-CM | POA: Diagnosis not present

## 2016-10-20 DIAGNOSIS — Z885 Allergy status to narcotic agent status: Secondary | ICD-10-CM | POA: Insufficient documentation

## 2016-10-20 DIAGNOSIS — R21 Rash and other nonspecific skin eruption: Secondary | ICD-10-CM | POA: Diagnosis present

## 2016-10-20 DIAGNOSIS — R300 Dysuria: Secondary | ICD-10-CM

## 2016-10-20 LAB — POCT URINALYSIS DIP (DEVICE)
Bilirubin Urine: NEGATIVE
Glucose, UA: NEGATIVE mg/dL
Hgb urine dipstick: NEGATIVE
Ketones, ur: NEGATIVE mg/dL
Leukocytes, UA: NEGATIVE
NITRITE: NEGATIVE
PH: 7 (ref 5.0–8.0)
Protein, ur: 100 mg/dL — AB
SPECIFIC GRAVITY, URINE: 1.02 (ref 1.005–1.030)
UROBILINOGEN UA: 2 mg/dL — AB (ref 0.0–1.0)

## 2016-10-20 MED ORDER — KETOCONAZOLE 2 % EX CREA: 1.0000 "application " | TOPICAL_CREAM | Freq: Two times a day (BID) | CUTANEOUS | 0 refills | Status: DC

## 2016-10-20 NOTE — ED Provider Notes (Signed)
  [facility]   [id number] 10/20/16 Arrival Time: 1937   SUBJECTIVE:  Jackson Jackson is a 21 y.o. male who presents to the urgent care  with complaint of rash on his scrotum. He was evaluated at [facility] physicians 5 days ago, diagnosed with jock itch, and started on clotrimazole betamethasone. The itching is gone away, but he has had redness on his scrotum. He is also concerned about some "bumps" on his penis, stating he was exposed to herpes 2 months ago. He is also complaining of urinary fullness, saying that after he voids, he feels like there is still pee in his bladder. He denies any dysuria, frequency, or discharge  ROS: As per HPI, remainder of ROS negative.   OBJECTIVE:  There were no vitals filed for this visit.   General appearance: alert; no distress HEENT: normocephalic; atraumatic; conjunctivae normal;  Neck: Trachea midline, no JVD noted Lungs: clear to auscultation bilaterally Heart: regular rate and rhythm Abdomen: soft, non-tender; bowel sounds normal; no masses or organomegaly; no guarding or rebound tenderness, no CVA tenderness GU: Scrotum is markedly erythemic, no vesicular lesions, there are several small red lesions on the glans penis, they are not vesicular. No discharge from the urinary meatus Musculoskeletal/extremities: Pulses +2, grossly symmetrical, no dependent edema Skin: warm and dry Neurologic: Grossly normal Psychological:  alert and cooperative; normal mood and affect     ASSESSMENT & PLAN:  1. Dysuria   2. Tinea cruris     Meds ordered this encounter  Medications  . ketoconazole (NIZORAL) 2 % cream    Sig: Apply 1 application topically 2 (two) times daily.    Dispense:  15 g    Refill:  0    Order Specific Question:   Supervising Provider    Answer:   Elvina Sidle [[id number]]   Urine cytology, HSV culture and typing, and UA were obtained, no evidence of urinary tract infection. Recommend abstaining from caffeine containing  products. Switched from combination steroid antifungal to antifungal cream. Return as necessary  Reviewed expectations re: course of current medical issues. Questions answered. Outlined signs and symptoms indicating need for more acute intervention. Patient verbalized understanding. After Visit Summary given.    Procedures:     Results for orders placed or performed during the hospital encounter of 10/20/16  POCT urinalysis dip (device)  Result Value Ref Range   Glucose, UA NEGATIVE NEGATIVE mg/dL   Bilirubin Urine NEGATIVE NEGATIVE   Ketones, ur NEGATIVE NEGATIVE mg/dL   Specific Gravity, Urine 1.020 1.005 - 1.030   Hgb urine dipstick NEGATIVE NEGATIVE   pH 7.0 5.0 - 8.0   Protein, ur 100 (A) NEGATIVE mg/dL   Urobilinogen, UA 2.0 (H) 0.0 - 1.0 mg/dL   Nitrite NEGATIVE NEGATIVE   Leukocytes, UA NEGATIVE NEGATIVE    Labs Reviewed  POCT URINALYSIS DIP (DEVICE) - Abnormal; Notable for the following:       Result Value   Protein, ur 100 (*)    Urobilinogen, UA 2.0 (*)    All other components within normal limits  HSV CULTURE AND TYPING  URINE CYTOLOGY ANCILLARY ONLY    No results found.  Allergies  Allergen Reactions  . Vicodin [Hydrocodone-Acetaminophen] Nausea And Vomiting    PMHx, SurgHx, SocialHx, Medications, and Allergies were reviewed in the Visit Navigator and updated as appropriate.       Dorena Bodo, NP 10/20/16 2137

## 2016-10-20 NOTE — ED Triage Notes (Addendum)
Patient with rash to penile rash, was given cream for it 5 days.   Patient states that he was exposed to herpes 2 months ago. States that he has not been tested for stds. States has not had any symptoms other than the rash.   States that he does have filling of fullness in bladder even after urinating. Denies dysuria.

## 2016-10-20 NOTE — Discharge Instructions (Signed)
You are being tested for a plethora of various conditions. If you are positive we will call you. Or, you can sign up for mychart and see your results.

## 2016-10-21 LAB — URINE CYTOLOGY ANCILLARY ONLY
CHLAMYDIA, DNA PROBE: NEGATIVE
Neisseria Gonorrhea: NEGATIVE
Trichomonas: NEGATIVE

## 2016-10-23 LAB — HSV CULTURE AND TYPING

## 2016-10-24 ENCOUNTER — Ambulatory Visit (HOSPITAL_COMMUNITY)
Admission: EM | Admit: 2016-10-24 | Discharge: 2016-10-24 | Disposition: A | Discharge status: Home / Self Care | Payer: Managed Care, Other (non HMO) | Attending: Family | Admitting: Family

## 2016-10-24 ENCOUNTER — Encounter (HOSPITAL_COMMUNITY): Payer: Self-pay | Admitting: Emergency Medicine

## 2016-10-24 DIAGNOSIS — R21 Rash and other nonspecific skin eruption: Secondary | ICD-10-CM | POA: Diagnosis not present

## 2016-10-24 MED ORDER — TRIAMCINOLONE ACETONIDE 0.1 % EX CREA: TOPICAL_CREAM | CUTANEOUS | 0 refills | Status: DC

## 2016-10-24 NOTE — Discharge Instructions (Signed)
Recommend apply a thin layer of Triamcinolone cream to area once daily as needed. If rash does not improve within the next week, follow-up with a Dermatologist as recommended.

## 2016-10-24 NOTE — ED Triage Notes (Signed)
Pt was tested for STD's here in the clinic 4 days ago.  All tests came back negative.  Pt was treated for jock itch about a month ago.  He still has the rash on his scrotum but has no symptoms.  Pt states he was to return if all tests were negative to have further testing to see what the rash is.

## 2016-10-24 NOTE — ED Provider Notes (Signed)
[facility]    CSN: [id number] Arrival date & time: 10/24/16  1239     History   Chief Complaint Chief Complaint  Patient presents with  . Rash    HPI Jackson Jackson is a 21 y.o. male.   21 year old male presents with continued concern over rash on penis. 1st experienced a rash on his scrotum and penis about 6 weeks ago. Was seen at [facility] and dx with jock itch- prescribed anti-fungal cream with steroid. This almost completely resolved his rash. His partner had a recent outbreatk of Herpes so he wanted additional testing since he had unprotected intercourse with her. He went to the ER twice in the last 2 weeks with concern over possible STD exposure. He had refused testing on the first visit. On the 2nd visit, he requested blood work for HSV but he has a history of cold sores so it was explained to him that he would test positive for HSV. He continues to be nervous about possible infections and came to Urgent Care 4 days ago. He was tested for GC/Chlamydia/Trich as well as a scraping of any lesions of his rash for HSV- all the results were negative. He continues to look and "irritate" his penis and scrotal area trying to examine area for any potential lesions. No other chronic health issues. Takes no daily medication.    The history is provided by the patient.    Past Medical History:  Diagnosis Date  . 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 Bankart lesion of left shoulder 05/25/2016    Past Surgical History:  Procedure Laterality Date  . INGUINAL HERNIA REPAIR    . SHOULDER ARTHROSCOPY WITH BANKART REPAIR Left 07/08/2016   Procedure: LEFT SHOULDER ARTHROSCOPY WITH BANKART REPAIR;  Surgeon: Salvatore Marvel, MD;  Location: [facility];  Service:  Orthopedics;  Laterality: Left;  . TONSILLECTOMY AND ADENOIDECTOMY  08/16/2007       Home Medications    Prior to Admission medications   Medication Sig Start Date End Date Taking? Authorizing Provider  ketoconazole (NIZORAL) 2 % cream Apply 1 application topically 2 (two) times daily. 10/20/16  Yes Dorena Bodo, NP  DiphenhydrAMINE HCl (BENADRYL ALLERGY PO) Take by mouth.    [provider]  triamcinolone cream (KENALOG) 0.1 % Apply a thin layer to affected area once daily as needed 10/24/16   Sudie Grumbling, NP    Family History History reviewed. No pertinent family history.  Social History Social History  Substance Use Topics  . Smoking status: Never Smoker  . Smokeless tobacco: Never Used  . Alcohol use Yes     Comment: occasionally      Allergies   Vicodin [hydrocodone-acetaminophen]   Review of Systems Review of Systems  Constitutional: Negative for activity change, appetite change, chills, diaphoresis, fatigue and fever.  HENT: Negative for mouth sores, postnasal drip and sore throat.   Respiratory: Negative for cough, chest tightness and shortness of breath.   Gastrointestinal: Negative for abdominal pain, blood in stool, nausea and vomiting.  Genitourinary: Negative for decreased urine volume, difficulty urinating, discharge, dysuria, flank pain, frequency, genital sores, hematuria, penile pain, penile swelling, scrotal swelling, testicular pain and urgency.  Musculoskeletal: Negative for arthralgias, back pain, myalgias and neck pain.  Skin:  Positive for rash. Negative for wound.  Neurological: Negative for dizziness, weakness, light-headedness, numbness and headaches.  Hematological: Negative for adenopathy. Does not bruise/bleed easily.  Psychiatric/Behavioral: The patient is nervous/anxious.      Physical Exam Triage Vital Signs ED Triage Vitals  Enc Vitals Group     BP 10/24/16 1314 (!) 145/78     Pulse Rate 10/24/16 1314 61     Resp  --      Temp 10/24/16 1314 98.2 F (36.8 C)     Temp Source 10/24/16 1314 Oral     SpO2 10/24/16 1314 100 %     Weight --      Height --      Head Circumference --      Peak Flow --      Pain Score 10/24/16 1315 0     Pain Loc --      Pain Edu? --      Excl. in GC? --    No data found.   Updated Vital Signs BP (!) 145/78 (BP Location: Left Arm)   Pulse 61   Temp 98.2 F (36.8 C) (Oral)   SpO2 100%   Visual Acuity Right Eye Distance:   Left Eye Distance:   Bilateral Distance:    Right Eye Near:   Left Eye Near:    Bilateral Near:     Physical Exam  Constitutional: He is oriented to person, place, and time. He appears well-developed and well-nourished. No distress.  HENT:  Head: Normocephalic and atraumatic.  Mouth/Throat: Oropharynx is clear and moist.  Eyes: Conjunctivae and EOM are normal.  Neck: Normal range of motion.  Cardiovascular: Normal rate.   Pulmonary/Chest: Effort normal.  Abdominal: Hernia confirmed negative in the right inguinal area and confirmed negative in the left inguinal area.  Genitourinary: Testes normal. Cremasteric reflex is present. Right testis shows no mass, no swelling and no tenderness. Left testis shows no mass, no swelling and no tenderness. Circumcised. Penile erythema present. No penile tenderness. No discharge found.     Genitourinary Comments: A few small red, slightly raised maculopapular lesions present on head of penis and base of shaft. No surrounding erythema. No ulcers or discharge. Non-painful. No swelling or penile or scrotal tenderness present.   Musculoskeletal: Normal range of motion.  Lymphadenopathy: No inguinal adenopathy noted on the right or left side.  Neurological: He is alert and oriented to person, place, and time.  Skin: Skin is warm and dry. Rash noted. No erythema.  Psychiatric: His mood appears anxious. His speech is rapid and/or pressured. He is hyperactive. Cognition and memory are normal.     UC  Treatments / Results  Labs (all labs ordered are listed, but only abnormal results are displayed) Labs Reviewed - No data to display  EKG  EKG Interpretation None       Radiology No results found.  Procedures Procedures (including critical care time)  Medications Ordered in UC Medications - No data to display   Initial Impression / Assessment and Plan / UC Course  I have reviewed the triage vital signs and the nursing notes.  Pertinent labs & imaging results that were available during my care of the patient were reviewed by me and considered in my medical decision making (see chart for details).    Discussed with patient that his rash is not herpetic. Have tested for other common STD's which results are all negative and asymptomatic. Rash does not appear to be syphilis but encouraged to have  blood work for RPR and HIV but patient declined. Discussed that he appears to have irritation in his genital area. May be Tinea resolving or be irritant dermatitis. May apply a very thin layer of Triamcinolone cream to areas that itch once daily- use sparingly. Recommend continue Ketoconazole cream use for 1 more week. If rash does not improve within the next week, follow-up with a Dermatologist or Urologist for further evaluation.   Final Clinical Impressions(s) / UC Diagnoses   Final diagnoses:  Rash    New Prescriptions Discharge Medication List as of 10/24/2016  3:43 PM    START taking these medications   Details  triamcinolone cream (KENALOG) 0.1 % Apply a thin layer to affected area once daily as needed, Normal         Controlled Substance Prescriptions [place] Controlled Substance Registry consulted? Not Applicable   Sudie Grumbling, NP 10/25/16 1049

## 2017-06-24 ENCOUNTER — Encounter (HOSPITAL_COMMUNITY): Payer: Self-pay | Admitting: Emergency Medicine

## 2017-06-24 ENCOUNTER — Ambulatory Visit (HOSPITAL_COMMUNITY)
Admission: EM | Admit: 2017-06-24 | Discharge: 2017-06-24 | Disposition: A | Discharge status: Home / Self Care | Payer: Managed Care, Other (non HMO) | Attending: Family Medicine | Admitting: Family Medicine

## 2017-06-24 ENCOUNTER — Other Ambulatory Visit: Payer: Self-pay

## 2017-06-24 DIAGNOSIS — R0789 Other chest pain: Secondary | ICD-10-CM | POA: Diagnosis not present

## 2017-06-24 NOTE — ED Provider Notes (Addendum)
[facility]   [id number] 06/24/17 Arrival Time: 7829  ASSESSMENT & PLAN:  1. Atypical chest pain    Conservative measures indicated. Worsening signs and symptoms discussed and patient verbalized understanding. Given his history he would benefit from seeing his cardiologist. ECG without acute changes.  Chest pain precautions given. Reviewed expectations re: course of current medical issues. Questions answered. Outlined signs and symptoms indicating need for more acute intervention. Patient verbalized understanding. After Visit Summary given.   SUBJECTIVE:  Jackson Jackson is a 22 y.o. male who presents with complaint of chest discomfort. Overall poor historian who reports history of "a leaky aortic valve." "Told this about four years ago. Supposed to see my cardiologist every three months but that hasn't happened." Overall has been well with occasional upper L anterior chest and shoulder discomfort. He attributes this to shoulder surgery last year. Over the past two days reports increase in frequency of this sharp pain. Same location. No specific aggravating or alleviating factors reported. No associated n/v/diaphoresis/SOB. Usually lasts a few minutes then resolves. No injuries. No syncope or feeling like he's going to pass out. Regularly exercises. No worsening of symptoms with exertion. No OTC treatment.   ROS: As per HPI.   OBJECTIVE:  Vitals:   06/24/17 1014  BP: 132/80  Pulse: 79  Resp: 16  Temp: 98.2 F (36.8 C)  TempSrc: Oral  SpO2: 98%    General appearance: alert; no distress Eyes: PERRLA; EOMI; conjunctiva normal HENT: normocephalic; atraumatic Neck: supple Lungs: unlabored respirations; clear to auscultation bilaterally Heart: regular rate and rhythm; no murmer appreciated Chest Wall: nontender Extremities: no cyanosis or edema; symmetrical with no gross deformities Skin: warm and dry Psychological: alert and cooperative; normal mood and  affect  ECG: Orders placed or performed during the hospital encounter of 06/24/17  . ED EKG  . ED EKG    Allergies  Allergen Reactions  . Vicodin [Hydrocodone-Acetaminophen] Nausea And Vomiting    Past Medical History:  Diagnosis Date  . 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 Marital status: Single    Spouse name: Not on file  . Number of children: Not on file  . Years of education: Not on file  . Highest education level: Not on file  Occupational History  . Not on file  Social Needs  . Financial resource strain: Not on file  . Food insecurity:    Worry: Not on file    Inability: Not on file  . Transportation needs:    Medical: Not on file    Non-medical: Not on file  Tobacco Use  . Smoking status: Never Smoker  . Smokeless tobacco: Never Used  Substance and Sexual Activity  . Alcohol use: Yes    Comment: occasionally   . Drug use: Yes    Types: Marijuana    Comment: every day  . Sexual activity: Not on file  Lifestyle  . Physical activity:    Days per week: Not on file    Minutes per session: Not on file  . Stress: Not on file  Relationships  . Social connections:    Talks on phone: Not on file    Gets together: Not on file    Attends religious service: Not on file    Active member of club or organization: Not on file    Attends meetings of clubs or  organizations: Not on file    Relationship status: Not on file  . Intimate partner violence:    Fear of current or ex partner: Not on file    Emotionally abused: Not on file    Physically abused: Not on file    Forced sexual activity: Not on file  Other Topics Concern  . Not on file  Social History Narrative  . Not on file   FH: heart valve problems  Past Surgical History:  Procedure Laterality Date  . INGUINAL HERNIA REPAIR    . SHOULDER ARTHROSCOPY WITH BANKART  REPAIR Left 07/08/2016   Procedure: LEFT SHOULDER ARTHROSCOPY WITH BANKART REPAIR;  Surgeon: Salvatore MarvelWainer, Robert, MD;  Location: [facility];  Service: Orthopedics;  Laterality: Left;  . TONSILLECTOMY AND ADENOIDECTOMY  08/16/2007     [name], [name], MD 06/24/17 1130    [name], [name], MD 06/24/17 1130

## 2017-06-24 NOTE — Discharge Instructions (Addendum)
Please return here or to the Emergency Department immediately should you feel worse in any way or have any of the following symptoms: increasing or different chest pain, pain that spreads to your arm, neck, jaw, back or abdomen, shortness of breath, or nausea and vomiting. ° °

## 2017-06-24 NOTE — ED Triage Notes (Signed)
Pt reports central to left sided atypical chest pain intermittently over the last couple of months.  He describes the pain as a tightness with stabbing pains.  He reports radiation down the left arm with associated numbness.  He also reports tachycardia, SOB, h/a, and near syncope at times with this pain.  He states it will go away in about ten minutes with rest.  He reports a family history of early MI, HBP, and an aortic valve issue in him at age 22.  Pt is in NAD at this time.

## 2020-07-31 ENCOUNTER — Ambulatory Visit: Payer: Self-pay

## 2020-07-31 ENCOUNTER — Emergency Department (HOSPITAL_COMMUNITY): Payer: Managed Care, Other (non HMO)

## 2020-07-31 ENCOUNTER — Emergency Department (HOSPITAL_COMMUNITY)
Admission: EM | Admit: 2020-07-31 | Discharge: 2020-07-31 | Disposition: A | Discharge status: Home / Self Care | Payer: Managed Care, Other (non HMO) | Attending: Emergency Medicine | Admitting: Emergency Medicine

## 2020-07-31 ENCOUNTER — Encounter (HOSPITAL_COMMUNITY): Payer: Self-pay | Admitting: Emergency Medicine

## 2020-07-31 ENCOUNTER — Other Ambulatory Visit: Payer: Self-pay

## 2020-07-31 DIAGNOSIS — Z91040 Latex allergy status: Secondary | ICD-10-CM | POA: Diagnosis not present

## 2020-07-31 DIAGNOSIS — R0789 Other chest pain: Secondary | ICD-10-CM | POA: Diagnosis present

## 2020-07-31 DIAGNOSIS — M25512 Pain in left shoulder: Secondary | ICD-10-CM | POA: Diagnosis not present

## 2020-07-31 DIAGNOSIS — R0602 Shortness of breath: Secondary | ICD-10-CM | POA: Diagnosis not present

## 2020-07-31 DIAGNOSIS — R079 Chest pain, unspecified: Secondary | ICD-10-CM

## 2020-07-31 LAB — BASIC METABOLIC PANEL
Anion gap: 7 (ref 5–15)
BUN: 8 mg/dL (ref 6–20)
CO2: 26 mmol/L (ref 22–32)
Calcium: 8.8 mg/dL — ABNORMAL LOW (ref 8.9–10.3)
Chloride: 104 mmol/L (ref 98–111)
Creatinine, Ser: 1.1 mg/dL (ref 0.61–1.24)
GFR, Estimated: >60 mL/min (ref >60)
Glucose, Bld: 110 mg/dL — ABNORMAL HIGH (ref 70–99)
Potassium: 4 mmol/L (ref 3.5–5.1)
Sodium: 137 mmol/L (ref 135–145)

## 2020-07-31 LAB — CBC WITH DIFFERENTIAL/PLATELET
Abs Immature Granulocytes: 0.05 10*3/uL (ref 0.00–0.07)
Basophils Absolute: 0.1 10*3/uL (ref 0.0–0.1)
Basophils Relative: 1 %
Eosinophils Absolute: 0.2 10*3/uL (ref 0.0–0.5)
Eosinophils Relative: 3 %
HCT: 43.6 % (ref 39.0–52.0)
Hemoglobin: 15.2 g/dL (ref 13.0–17.0)
Immature Granulocytes: 1 %
Lymphocytes Relative: 29 %
Lymphs Abs: 2.4 10*3/uL (ref 0.7–4.0)
MCH: 31.6 pg (ref 26.0–34.0)
MCHC: 34.9 g/dL (ref 30.0–36.0)
MCV: 90.6 fL (ref 80.0–100.0)
Monocytes Absolute: 0.6 10*3/uL (ref 0.1–1.0)
Monocytes Relative: 7 %
Neutro Abs: 4.8 10*3/uL (ref 1.7–7.7)
Neutrophils Relative %: 59 %
Platelets: 201 10*3/uL (ref 150–400)
RBC: 4.81 MIL/uL (ref 4.22–5.81)
RDW: 12.3 % (ref 11.5–15.5)
WBC: 8.1 10*3/uL (ref 4.0–10.5)
nRBC: 0 % (ref 0.0–0.2)

## 2020-07-31 LAB — TROPONIN I (HIGH SENSITIVITY)
Troponin I (High Sensitivity): 3 ng/L (ref <18)
Troponin I (High Sensitivity): <2 ng/L (ref <18)

## 2020-07-31 LAB — D-DIMER, QUANTITATIVE: D-Dimer, Quant: <0.27 ug/mL-FEU (ref 0.00–0.50)

## 2020-07-31 LAB — HEPATIC FUNCTION PANEL
ALT: 15 U/L (ref 0–44)
AST: 14 U/L — ABNORMAL LOW (ref 15–41)
Albumin: 4.1 g/dL (ref 3.5–5.0)
Alkaline Phosphatase: 72 U/L (ref 38–126)
Bilirubin, Direct: 0.2 mg/dL (ref 0.0–0.2)
Indirect Bilirubin: 1.3 mg/dL — ABNORMAL HIGH (ref 0.3–0.9)
Total Bilirubin: 1.5 mg/dL — ABNORMAL HIGH (ref 0.3–1.2)
Total Protein: 6.5 g/dL (ref 6.5–8.1)

## 2020-07-31 LAB — BRAIN NATRIURETIC PEPTIDE: B Natriuretic Peptide: 42.1 pg/mL (ref 0.0–100.0)

## 2020-07-31 MED ORDER — KETOROLAC TROMETHAMINE 30 MG/ML IJ SOLN
30.0000 mg | Freq: Once | INTRAMUSCULAR | Status: DC
  Filled 2020-07-31: qty 1

## 2020-07-31 NOTE — Discharge Instructions (Signed)
As we discussed, your work-up today was reassuring.  You did have some intermittent high blood pressures.  This can be evaluated at your cardiology appointment when you set up an appointment for them.  Additionally, you can follow this up with a primary care doctor.  If you do not have a primary care doctor, I provided referral to [facility] wellness clinic.  Return the emergency department for any worsening chest pain, difficulty breathing, abdominal pain, nausea/vomiting or any other worsening concerning symptoms.

## 2020-07-31 NOTE — ED Provider Notes (Signed)
Emergency Medicine Provider Triage Evaluation Note  Jackson Jackson , a 25 y.o. male  was evaluated in triage.  Pt complains of gradual onset, constant, sharp/heavy, left sided chest pain x 1 week with SOB. 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 Did not continue following up with cardiology because they wanted to limit his activity. Saw UC last week and had EKG done with plans for referral to cardiologist however pt declined at that time. Pain worsened today prompting concern.  Review of Systems  Positive: + chest pain, SOB Negative: - palpitations, nausea, vomiting, diaphoresis  Physical Exam  BP 137/90 (BP Location: Left Arm)   Pulse 86   Temp 98.5 F (36.9 C) (Oral)   Resp 18   SpO2 96%  Gen:   Awake, no distress   Resp:  Normal effort  MSK:   Moves extremities without difficulty  Other:    Medical Decision Making  Medically screening exam initiated at 12:37 PM.  Appropriate orders placed.  Jackson Jackson was informed that the remainder of the evaluation will be completed by another provider, this initial triage assessment does not replace that evaluation, and the importance of remaining in the ED until their evaluation is complete.     Tanda Rockers, PA-C 07/31/20 1238    Jacalyn Lefevre, MD 07/31/20 [phone]

## 2020-07-31 NOTE — Telephone Encounter (Signed)
Pt with chest pain started this am 0930 and stated pain or "pressure" feels constant. Rates pain a 6/10 and radiating to left shoulder. Pt stated he has a h/o "leaky aortic valve." Pt stated that he is very nervous about the pain. Pt stated he lost vision briefly this am "couldn't focus." Care advise given an pt verbalized understanding.   Reason for Disposition . Dizziness or lightheadedness  Answer Assessment - Initial Assessment Questions 1. LOCATION: "Where does it hurt?"       Left side of chest 2. RADIATION: "Does the pain go anywhere else?" (e.g., into neck, jaw, arms, back)     Feels like pressure - shoulder 3. ONSET: "When did the chest pain begin?" (Minutes, hours or days)      0930 4. PATTERN "Does the pain come and go, or has it been constant since it started?"  "Does it get worse with exertion?"      constant 5. DURATION: "How long does it last" (e.g., seconds, minutes, hours)     constant 6. SEVERITY: "How bad is the pain?"  (e.g., Scale 1-10; mild, moderate, or severe)    - MILD (1-3): doesn't interfere with normal activities     - MODERATE (4-7): interferes with normal activities or awakens from sleep    - SEVERE (8-10): excruciating pain, unable to do any normal activities      6-7 7. CARDIAC RISK FACTORS: "Do you have any history of heart problems or risk factors for heart disease?" (e.g., angina, prior heart attack; diabetes, high blood pressure, high cholesterol, smoker, or strong family history of heart disease)     Leaky aorta valve- recent  8. PULMONARY RISK FACTORS: "Do you have any history of lung disease?"  (e.g., blood clots in lung, asthma, emphysema, birth control pills)     no 9. CAUSE: "What do you think is causing the chest pain?"     Leaky aortic valve or heart related 10. OTHER SYMPTOMS: "Do you have any other symptoms?" (e.g., dizziness, nausea, vomiting, sweating, fever, difficulty breathing, cough)       Dizziness, lost vision couldn't  Protocols  used: CHEST PAIN-A-AH

## 2020-07-31 NOTE — ED Triage Notes (Signed)
C/o L sided chest pain and SOB x 1 week.  Denies nausea and vomiting.

## 2020-07-31 NOTE — ED Provider Notes (Signed)
[facility] EMERGENCY DEPARTMENT Provider Note   CSN: [id number] Arrival date & time: 07/31/20  1218     History Chief Complaint  Patient presents with  . Chest Pain    Jackson Jackson is a 25 y.o. male past medical history of aortic valve insufficiency who presents for evaluation of chest pain.  Patient reports he has had intermittent chest pain for about 2 weeks.  He states that it is gotten more frequent, more severe for the last week or so.  He describes it as a "constant, heaviness like someone is pressing her hand into my chest" to the left side of his chest.  Currently rates the pain at 5/10.  He states that he has history of shoulder pain and and always has some degree of pain into his left shoulder.  He states at times, he will get diaphoretic with the pain and other times he will not.  He states the pain is worse with deep inspiration.  He has not noticed a significant change in the pain with exertion but does state that exertional activity makes him tired very quickly and makes him have some shortness of breath which he states is abnormal for him.  He has not taken anything for the pain.  He saw urgent care about a week ago.  He was given a cardiology referral but he declined at that time stating that he "he needed to process a few things."  He comes in today because he states the pain is gotten more constant and more severe.  He states over the last week or so, it has been there all the time and does not go away.  He states he has episodes where the pain gets so intense that he feels like he is going to pass out and will lose vision.  He states he does not lose consciousness during this time.  He states that these episodes happen to him as a kid which is what prompted his mom to have him evaluated by cardiologist in the first place.  He reports that when he was 15, he started cardiology and was noted to have hypertension.  Additionally, they did an echo that showed mild  aortic insufficiency.  He was instructed to follow-up with cardiology every 3 to 4 months but he did not as he did not want his activity limited.  He states he has not seen cardiology since he was a teenager.  He used to smoke cigarettes but stopped in 2021.  He now vapes.  He estimates that he vapes about 40-50 times a day.  He does not use any cocaine or heroin. He tells me with He states that his aunt had a heart attack at a young age but is unsure of how old.  He denies any history of diabetes.  He is not currently on any medication for blood pressure. He denies any exogenous hormone use, recent immobilization, prior history of DVT/PE, recent surgery, leg swelling, or long travel.  The history is provided by the patient.    HPI: A 25 year old patient with a history of hypertension presents for evaluation of chest pain. Initial onset of pain was more than 6 hours ago. The patient's chest pain is described as heaviness/pressure/tightness and is not worse with exertion. The patient's chest pain is middle- or left-sided, is not well-localized, is not sharp and does not radiate to the arms/jaw/neck. The patient does not complain of nausea and denies diaphoresis. The patient has smoked in  the past 90 days. The patient has no history of stroke, has no history of peripheral artery disease, denies any history of treated diabetes, has no relevant family history of coronary artery disease (first degree relative at less than age 25), has no history of hypercholesterolemia and does not have an elevated BMI (>=30).   Past Medical History:  Diagnosis Date  . 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 Bankart lesion of left shoulder 05/25/2016    Past Surgical History:  Procedure Laterality Date  . INGUINAL HERNIA  REPAIR    . SHOULDER ARTHROSCOPY WITH BANKART REPAIR Left 07/08/2016   Procedure: LEFT SHOULDER ARTHROSCOPY WITH BANKART REPAIR;  Surgeon: Salvatore MarvelWainer, Robert, MD;  Location: [facility];  Service: Orthopedics;  Laterality: Left;  . TONSILLECTOMY AND ADENOIDECTOMY  08/16/2007       No family history on file.  Social History   Tobacco Use  . Smoking status: Never Smoker  . Smokeless tobacco: Never Used  Substance Use Topics  . Alcohol use: Yes    Comment: occasionally   . Drug use: Yes    Types: Marijuana    Comment: every day    Home Medications Prior to Admission medications   Medication Sig Start Date End Date Taking? Authorizing Provider  ketoconazole (NIZORAL) 2 % cream Apply 1 application topically 2 (two) times daily. Patient not taking: Reported on 07/31/2020 10/20/16   Dorena BodoKennard, Lawrence, NP  triamcinolone cream (KENALOG) 0.1 % Apply a thin layer to affected area once daily as needed Patient not taking: Reported on 07/31/2020 10/24/16   Sudie GrumblingAmyot, Ann Berry, NP    Allergies    Vicodin [hydrocodone-acetaminophen] and Latex  Review of Systems   Review of Systems  Constitutional: Negative for fever.  Respiratory: Positive for shortness of breath. Negative for cough.   Cardiovascular: Positive for chest pain. Negative for leg swelling.  Gastrointestinal: Negative for abdominal pain, nausea and vomiting.  Genitourinary: Negative for dysuria and hematuria.  Neurological: Negative for headaches.  All other systems reviewed and are negative.   Physical Exam Updated Vital Signs BP (!) 146/87   Pulse (!) 49   Temp 98.5 F (36.9 C) (Oral)   Resp 16   SpO2 100%   Physical Exam Vitals and nursing note reviewed.  Constitutional:      Appearance: Normal appearance. He is well-developed.  HENT:     Head: Normocephalic and atraumatic.  Eyes:     General: Lids are normal.     Conjunctiva/sclera: Conjunctivae normal.     Pupils: Pupils are equal, round, and reactive  to light.  Cardiovascular:     Rate and Rhythm: Normal rate and regular rhythm.     Pulses: Normal pulses.          Radial pulses are 2+ on the right side and 2+ on the left side.       Dorsalis pedis pulses are 2+ on the right side and 2+ on the left side.     Heart sounds: Normal heart sounds. No murmur heard. No friction rub. No gallop.      Comments: No murmur.  Pulmonary:     Effort: Pulmonary effort is normal.     Breath sounds: Normal breath sounds.     Comments: Lungs clear to auscultation bilaterally.  Symmetric  chest rise.  No wheezing, rales, rhonchi. Abdominal:     Palpations: Abdomen is soft. Abdomen is not rigid.     Tenderness: There is no abdominal tenderness. There is no guarding.  Musculoskeletal:        General: Normal range of motion.     Cervical back: Full passive range of motion without pain.     Comments: BLE are symmetric in appearance without any overlying warmth, erythema, edema.   Skin:    General: Skin is warm and dry.     Capillary Refill: Capillary refill takes less than 2 seconds.  Neurological:     Mental Status: He is alert and oriented to person, place, and time.  Psychiatric:        Speech: Speech normal.     ED Results / Procedures / Treatments   Labs (all labs ordered are listed, but only abnormal results are displayed) Labs Reviewed  BASIC METABOLIC PANEL - Abnormal; Notable for the following components:      Result Value   Glucose, Bld 110 (*)    Calcium 8.8 (*)    All other components within normal limits  HEPATIC FUNCTION PANEL - Abnormal; Notable for the following components:   AST 14 (*)    Total Bilirubin 1.5 (*)    Indirect Bilirubin 1.3 (*)    All other components within normal limits  CBC WITH DIFFERENTIAL/PLATELET  BRAIN NATRIURETIC PEPTIDE  D-DIMER, QUANTITATIVE  TROPONIN I (HIGH SENSITIVITY)  TROPONIN I (HIGH SENSITIVITY)    EKG EKG Interpretation  Date/Time:  Tuesday Jul 31 2020 19:45:08 EDT Ventricular Rate:   65 PR Interval:  160 QRS Duration: 112 QT Interval:  396 QTC Calculation: 412 R Axis:   84 Text Interpretation: Sinus arrhythmia RSR' in V1 or V2, probably normal variant Inferior Q wavesNo significant change since last EKG Confirmed by Linwood Dibbles [phone]) on 07/31/2020 7:53:08 PM   Radiology DG Chest 2 View  Result Date: 07/31/2020 CLINICAL DATA:  Chest pain.  Shortness of breath EXAM: CHEST - 2 VIEW COMPARISON:  Chest x-ray 07/04/2004. FINDINGS: Mediastinum and hilar structures normal. Lungs are clear. No pleural effusion or pneumothorax. Heart size normal. IMPRESSION: No acute cardiopulmonary disease. Electronically Signed   By: Maisie Fus  Register   On: 07/31/2020 13:16    Procedures Procedures   Medications Ordered in ED Medications  ketorolac (TORADOL) 30 MG/ML injection 30 mg (0 mg Intravenous Hold 07/31/20 1948)    ED Course  I have reviewed the triage vital signs and the nursing notes.  Pertinent labs & imaging results that were available during my care of the patient were reviewed by me and considered in my medical decision making (see chart for details).    MDM Rules/Calculators/A&P HEAR Score: 2                        24 year old male who presents for evaluation of chest pain, shortness of breath.  Reports this is been ongoing for few weeks.  Seen in urgent care about a week ago and was recommended for cardiology referral but he declined.  Comes in today because he feels like his pain is getting more severe, more frequent.  History of aortic insufficiency and was seen by pediatric cardiology but is not followed up with him in several years.  He tells me he also has a history of A. fib though I do not see any mention of this in his chart.  On initial arrival, he is  afebrile, nontoxic-appearing.  His initial blood pressure was 137/90.  Repeat blood pressure was 151/86.  On my exam, I do not hear any murmur.  Lungs clear to auscultation.  He exhibits no signs of volume overload.   He does tell me that there is a pleuritic component to this chest pain and feels like he has dyspnea on exertion.  He does not have any significant PE risk factors but given pleuritic com.  Low suspicion for ACS etiology but is a consideration.  Do not suspect CHF.  History/physical exam not concerning for dissection.  We will plan to check labs, EKG, chest x-ray.  Review of his records. He was evaluated by Pediatric Cardiology in 2013 [facility] [place]).  He was noted to be hypertensive.  His blood work at that time was unremarkable.  He was pending a renal ultrasound for further evaluation of hypertension.  At that time, there is some question of whether this was anxiety related as he got stressed out, anxious whenever he was around doctors.  He was not started on any blood pressure medication.  At this time, he was also diagnosed with trace aortic insufficiency per an echocardiogram that was done.  Per note, this was "exceptionally mild and occurred through a structurally normal trileaflet aortic valve."  The recommendation was follow-up in 3 to 4 months with blood pressure measurements from school and reevaluation.  There was note that systemic blood pressure could increase progression of aortic insufficiency.  There is also mention of his aunt and his cardiology note.  It does look like she had VSD and had replacement for that.  There is no mention of family cardiac history.  BMP shows normal BUN and creatinine.  Initial troponin is negative.  CBC shows no leukocytosis.  Hemoglobin stable.  Chest x-ray shows no cardiomegaly.  No evidence of acute abnormality. D-dimer is negative. BNP is normal.   Delta trop is negative.   Discussed patient with Dr. Rennis Golden (Cardiology).  We reviewed patient's work-up here in the ED.  He feels that patient can be discharged home with cardiology referral.   Discussed with patient and patient's wife.  Patient is hemodynamically stable at this time.  Patient lives in this area  would prefer to be referred to cardiology in this area.  We will give him ambulatory referral to cardiology.  We discussed treatment options.  Encourage patient on NSAIDs.  I did offer patient muscle relaxers but he declined stating that he does not like that type of medication. At this time, patient exhibits no emergent life-threatening condition that require further evaluation in ED. Patient had ample opportunity for questions and discussion. All patient's questions were answered with full understanding. Strict return precautions discussed. Patient expresses understanding and agreement to plan.  Patient had ample opportunity for questions and discussion. All patient's questions were answered with full understanding.  Portions of this note were generated with Scientist, clinical (histocompatibility and immunogenetics). Dictation errors may occur despite best attempts at proofreading.   Final Clinical Impression(s) / ED Diagnoses Final diagnoses:  Nonspecific chest pain    Rx / DC Orders ED Discharge Orders         Ordered    Ambulatory referral to Cardiology        07/31/20 2029           Rosana Hoes 07/31/20 2305    Linwood Dibbles, MD 08/02/20 [phone]

## 2020-09-19 NOTE — Progress Notes (Deleted)
Cardiology Office Note:    Date:  09/19/2020   ID:  Jackson Jackson, DOB Nov 03, 1995, MRN [id number]  PCP:  Patient, No Pcp Per (Inactive)   [facility] Providers Cardiologist:  None {    Referring MD: Maxwell Caul, PA-C    History of Present Illness:    Jackson Jackson is a 25 y.o. male with a hx of tobacco use who was referred by Graciella Freer, PA-C for further evaluation of chest pain./  Patient was seen in the ER on 07/31/20. He presented with intermittent chest pain for 2 weeks. Pain was described pressure sensation. In the ER, trop negative x2. ECG without ischemic changes. He was discharged home with referral to Cardiology.  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     Allergies:   Vicodin [hydrocodone-acetaminophen] and Latex   Social History   Socioeconomic History   Marital status: Single    Spouse name: Not on file   Number of children: Not on file   Years of education: Not on file   Highest education level: Not on file  Occupational History   Not on file  Tobacco Use   Smoking status: Never   Smokeless tobacco: Never  Substance and Sexual Activity   Alcohol use: Yes    Comment: occasionally    Drug use: Yes    Types: Marijuana    Comment: every day   Sexual activity: Not on file  Other Topics Concern   Not on  file  Social History Narrative   Not on file   Social Determinants of Health   Financial Resource Strain: Not on file  Food Insecurity: Not on file  Transportation Needs: Not on file  Physical Activity: Not on file  Stress: Not on file  Social Connections: Not on file     Family History: The patient's ***family history is not on file.  ROS:   Please see the history of present illness.    *** All other systems reviewed and are negative.  EKGs/Labs/Other Studies Reviewed:    The following studies were reviewed today: TTE Jul 07, 2011: Interpretation Summary  1. Echocardiogram performed for this patient with systemic hypertension.  2. Trace aortic regurgitation through an otherwise normal appearing  trileaflet aortic valve.  3. No aortic stenosis. No LV enlargement.  4. No coarctation of aorta.  5. No LVH.  6. Normal coronary artery origins and proximal courses.  7. Left sided arch with Common Brachiocephalic Trunk (normal variant of  aortic arch branching).  8. Normal biventricular sizes and systolic function.  9. Normal diastolic function.   Cardiac Position  Levocardia with apex to the left. Abdominal situs solitus. Atrial situs  solitus. D Ventricular Loop. S Normal position great vessels.   Veins  Normal systemic venous drainage. Normal pulmonary venous drainage.   Atrium  Normal right atrial size. Normal left atrial size. No evidence  of atrial  septal defect.   Atrioventricular Valves  Normal mitral valve. No mitral valve insufficiency. Normal tricuspid valve.  No tricuspid valve insufficiency.   Left Ventricle  Normal left ventricle structure. Normal left ventricular dimension. Normal  left ventricular systolic function. Normal left ventricular diastolic  function.   Right Ventricle  Normal right ventricle structure. Normal right ventricular dimension. Normal  right ventricular systolic function.  Ventricular Septum  No evidence of ventricular septal defect.  Normal septal curvature.  Aortic Valve  Normal trileaftlet aortic valve. No aortic valve stenosis. Trivial aortic  valve insufficiency.  Pulmonary Valve  Normal pulmonic valve. No pulmonary valve stenosis. Trivial pulmonary  insufficiency (physiologic).  Conotruncus  Normal conotruncus.  Aorta   Left sided arch of normal caliber. Branching pattern consistent with Common  Brachiocephalic Trunk (normal variant). Normal aortic root size. No evidence  of coarctation of the aorta. Normal pulsatile flow in descending aorta.  Pulmonary Artery  Normal main pulmonary artery size. Normal pulmonary artery branches. No right  pulmonary artery stenosis. No left pulmonary artery stenosis.  Ductus Arteriosus  No patent ductus arteriosus.   Coronary Arteries  Normal coronary artery size and origins.  Fluid  No pericardial effusion.   EKG:  EKG is *** ordered today.  The ekg ordered today demonstrates ***  Recent Labs: 07/31/2020: ALT 15; B Natriuretic Peptide 42.1; BUN 8; Creatinine, Ser 1.10; Hemoglobin 15.2; Platelets 201; Potassium 4.0; Sodium 137  Recent Lipid Panel No results found for: CHOL, TRIG, HDL, CHOLHDL, VLDL, LDLCALC, LDLDIRECT   Risk Assessment/Calculations:   {Does this patient have ATRIAL FIBRILLATION?:[phone]}       Physical Exam:    VS:  There were no vitals taken for this visit.    Wt Readings from Last 3 Encounters:  09/16/16 190 lb (86.2 kg)  08/25/16 190 lb 14.4 oz (86.6 kg)  07/08/16 195 lb 12.8 oz (88.8 kg)     GEN: *** Well nourished, well developed in no acute distress HEENT: Normal NECK: No JVD; No carotid bruits LYMPHATICS: No lymphadenopathy CARDIAC: ***RRR, no murmurs, rubs, gallops RESPIRATORY:  Clear to auscultation without rales, wheezing or rhonchi  ABDOMEN: Soft, non-tender, non-distended MUSCULOSKELETAL:  No edema; No deformity  SKIN: Warm and dry NEUROLOGIC:  Alert and oriented x 3 PSYCHIATRIC:  Normal affect   ASSESSMENT:    No  diagnosis found. PLAN:    In order of problems listed above:  #Chest Pain: Atypical. -?Exercise stress test   {Are you ordering a CV Procedure (e.g. stress test, cath, DCCV, TEE, etc)?   Press F2        :[id number]}    Medication Adjustments/Labs and Tests Ordered: Current medicines are reviewed at length with the patient today.  Concerns regarding medicines are outlined above.  No orders of the defined types were placed in this encounter.  No orders of the defined types were placed in this encounter.   There are no Patient Instructions on file for this visit.   Signed, Meriam Sprague, MD  09/19/2020 1:42 PM    [facility]

## 2020-09-21 ENCOUNTER — Encounter (HOSPITAL_BASED_OUTPATIENT_CLINIC_OR_DEPARTMENT_OTHER): Payer: Self-pay | Admitting: *Deleted

## 2020-09-21 ENCOUNTER — Ambulatory Visit (INDEPENDENT_AMBULATORY_CARE_PROVIDER_SITE_OTHER): Payer: Managed Care, Other (non HMO) | Admitting: Cardiology

## 2020-09-21 ENCOUNTER — Other Ambulatory Visit: Payer: Self-pay

## 2020-09-21 VITALS — BP 118/70 | HR 90 | Ht 74.0 in | Wt 208.0 lb

## 2020-09-21 DIAGNOSIS — I351 Nonrheumatic aortic (valve) insufficiency: Secondary | ICD-10-CM | POA: Diagnosis not present

## 2020-09-21 DIAGNOSIS — R079 Chest pain, unspecified: Secondary | ICD-10-CM | POA: Diagnosis not present

## 2020-09-21 NOTE — Progress Notes (Signed)
Cardiology Office Note:    Date:  09/21/2020   ID:  Jackson Jackson, DOB [birth year], MRN [id number]  PCP:  Patient, No Pcp Per (Inactive)   [facility] Providers Cardiologist:  None {    Referring MD: Maxwell Caul, PA-C    History of Present Illness:    Jackson Jackson is a 25 y.o. male with a hx of tobacco use who was referred by Graciella Freer, PA-C for further evaluation of chest pain./  Patient was seen in the ER on 07/31/20. He presented with intermittent chest pain for 2 weeks. Pain was described pressure sensation. In the ER, trop negative x2. ECG without ischemic changes. He was discharged home with referral to Cardiology.  Today, the patient states he was seen in [facility] ED for chest pain that was severe and sharp in nature. Pain was worse when taking a deep breath. D-dimer there was negative. Trop also negative as detailed above. He was discharged home and symptoms had improved.  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 States he has had this has been ongoing since the age of 49. Was seen by a cardiologist in 2013 where TTE showed normal LVEF, trace AI, normal coronary origins, no coarctation. He was told to continue to follow-up, but he declined to do so. Denies any LE edema, palpitations, orthopnea, lightheadedness or dizziness.   Otherwise, he was also told he has high blood pressure. Was supposed to be on medication but he did not want to take it. Currently, BP 110s/70s today.  Notably, he continues to vape several times per day.   Family history: His grandfather died of CHF last year. His aunt had 4 strokes recently. His brother also went to a cardiologist for dizzy spells.    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     Allergies:   Vicodin [hydrocodone-acetaminophen] and Latex   Social History   Socioeconomic History   Marital status: Single    Spouse name: Not on file   Number of children: Not on file   Years of education: Not on file   Highest education level: Not on file  Occupational History   Not on file  Tobacco Use   Smoking status: Never   Smokeless tobacco: Never  Substance and Sexual Activity   Alcohol use: Yes    Comment: occasionally    Drug use: Yes    Types: Marijuana    Comment: every day   Sexual activity: Not on file  Other Topics Concern   Not on file  Social History Narrative   Not on file   Social Determinants of Health   Financial Resource Strain: Not on file  Food Insecurity: Not on file  Transportation Needs: Not on file  Physical Activity: Not on file  Stress: Not on file  Social Connections: Not on file  Family History: The patient's family history is not on file.  ROS:   Review of Systems  Constitutional:  Negative for fever and weight loss.  HENT:  Negative for congestion and hearing loss.   Eyes:  Negative for pain.  Respiratory:  Positive for shortness of breath. Negative for cough and wheezing.   Cardiovascular:  Positive for chest pain and PND. Negative for palpitations, orthopnea, claudication and leg swelling.  Gastrointestinal:  Negative for constipation, diarrhea, nausea and vomiting.  Genitourinary:  Negative for dysuria and frequency.  Musculoskeletal:  Negative for myalgias.  Neurological:  Negative for  dizziness and headaches.  Endo/Heme/Allergies:  Negative for environmental allergies.  Psychiatric/Behavioral:  Negative for depression. The patient is not nervous/anxious.    EKGs/Labs/Other Studies Reviewed:    The following studies were reviewed today: TTE 04-Jul-2011: TTE 2011/07/04: Interpretation Summary  1. Echocardiogram performed for this patient with systemic hypertension.  2. Trace aortic regurgitation through an otherwise normal appearing  trileaflet aortic valve.  3. No aortic stenosis. No LV enlargement.  4. No coarctation of aorta.  5. No LVH.  6. Normal coronary artery origins and proximal courses.  7. Left sided arch with Common Brachiocephalic Trunk (normal variant of  aortic arch branching).  8. Normal biventricular sizes and systolic function.  9. Normal diastolic function.   Cardiac Position  Levocardia with apex to the left. Abdominal situs solitus. Atrial situs  solitus. D Ventricular Loop. S Normal position great vessels.   Veins  Normal systemic venous drainage. Normal pulmonary venous drainage.   Atrium  Normal right atrial size. Normal left atrial size. No evidence of atrial  septal defect.   Atrioventricular Valves  Normal mitral valve. No mitral valve insufficiency. Normal tricuspid valve.  No tricuspid valve insufficiency.   Left Ventricle  Normal left ventricle structure. Normal left ventricular dimension. Normal  left ventricular systolic function. Normal left ventricular diastolic  function.   Right Ventricle  Normal right ventricle structure. Normal right ventricular dimension. Normal  right ventricular systolic function.  Ventricular Septum  No evidence of ventricular septal defect. Normal septal curvature.  Aortic Valve  Normal trileaftlet aortic valve. No aortic valve stenosis. Trivial aortic  valve insufficiency.  Pulmonary Valve  Normal pulmonic valve. No pulmonary valve stenosis. Trivial pulmonary  insufficiency (physiologic).   Conotruncus  Normal conotruncus.  Aorta   Left sided arch of normal caliber. Branching pattern consistent with Common  Brachiocephalic Trunk (normal variant). Normal aortic root size. No evidence  of coarctation of the aorta. Normal pulsatile flow in descending aorta.  Pulmonary Artery  Normal main pulmonary artery size. Normal pulmonary artery branches. No right  pulmonary artery stenosis. No left pulmonary artery stenosis.  Ductus Arteriosus  No patent ductus arteriosus.   Coronary Arteries  Normal coronary artery size and origins.  Fluid  No pericardial effusion.  EKG:    09/21/2020: Not yet completed.  Recent Labs: 07/31/2020: ALT 15; B Natriuretic Peptide 42.1; BUN 8; Creatinine, Ser 1.10; Hemoglobin 15.2; Platelets 201; Potassium 4.0; Sodium 137  Recent Lipid Panel No results found for: CHOL, TRIG, HDL, CHOLHDL, VLDL, LDLCALC, LDLDIRECT   Risk Assessment/Calculations:           Physical Exam:    VS:  BP 118/70   Pulse 90   Ht 6\' 2"  (1.88 m)   Wt 208 lb (94.3 kg)   SpO2 98%   BMI 26.71 kg/m     Wt Readings from Last 3 Encounters:  09/21/20 208 lb (94.3 kg)  09/16/16  190 lb (86.2 kg)  08/25/16 190 lb 14.4 oz (86.6 kg)     GEN: Well nourished, well developed in no acute distress HEENT: Normal NECK: No JVD; No carotid bruits LYMPHATICS: No lymphadenopathy CARDIAC: RRR, no murmurs, rubs, gallops RESPIRATORY:  Clear to auscultation without rales, wheezing or rhonchi  ABDOMEN: Soft, non-tender, non-distended MUSCULOSKELETAL:  No edema; No deformity  SKIN: Warm and dry NEUROLOGIC:  Alert and oriented x 3 PSYCHIATRIC:  Normal affect   ASSESSMENT:    1. Chest pain of uncertain etiology   2. Aortic valve insufficiency, etiology of cardiac valve disease unspecified    PLAN:    In order of problems listed above:  #Chest Pain: Patient has both pleuritic and pressure like chest pain that has been ongoing for several years. Recent work-up in ER  reassuringly normal with normal troponin, normal d-dimer and non-ischemic ECG. He was seen by Cardiology in 2013 with normal LVEF, trace AI, no coarctation or significant valve disease. Concern that his pressure like pain is secondary to possible vasospasm in the setting of vaping. Low suspicion for CAD as patient is very active and young. Pleuritic chest discomfort is unlikely cardiac and d-dimer normal with low suspicion for PE. Will check TTE and exercise stress test. -Check exercise stress -Check TTE -Recommend stopping vaping as can cause vasospasm -Declined medication  -Encouraged hydration  #Trace AI: Noted on TTE in 2013. No HF symptoms. Obtaining TTE as above. -Check TTE as above   Shared Decision Making/Informed Consent The risks [chest pain, shortness of breath, cardiac arrhythmias, dizziness, blood pressure fluctuations, myocardial infarction, stroke/transient ischemic attack, and life-threatening complications (estimated to be 1 in 10,000)], benefits (risk stratification, diagnosing coronary artery disease, treatment guidance) and alternatives of an exercise tolerance test were discussed in detail with Jackson Jackson and he agrees to proceed.   Follow-up prn Medication Adjustments/Labs and Tests Ordered: Current medicines are reviewed at length with the patient today.  Concerns regarding medicines are outlined above.  Orders Placed This Encounter  Procedures   Exercise Tolerance Test   ECHOCARDIOGRAM COMPLETE   No orders of the defined types were placed in this encounter.   Patient Instructions  Medication Instructions:  Your physician recommends that you continue on your current medications as directed. Please refer to the Current Medication list given to you today.  *If you need a refill on your cardiac medications before your next appointment, please call your pharmacy*   Lab Work: None ordered  If you have labs (blood work) drawn today and your tests are completely  normal, you will receive your results only by: MyChart Message (if you have MyChart) OR A paper copy in the mail If you have any lab test that is abnormal or we need to change your treatment, we will call you to review the results.   Testing/Procedures: Your physician has requested that you have an exercise tolerance test. For further information please visit [email or web address] Please also follow instruction sheet, as given.  Your physician has requested that you have an echocardiogram. Echocardiography is a painless test that uses sound waves to create images of your heart. It provides your doctor with information about the size and shape of your heart and how well your heart's chambers and valves are working. This procedure takes approximately one hour. There are no restrictions for this procedure.   Follow-Up: At [facility], you and your health needs are our priority.  As part of our continuing mission to provide you with exceptional heart care,  we have created designated Provider Care Teams.  These Care Teams include your primary Cardiologist (physician) and Advanced Practice Providers (APPs -  Physician Assistants and Nurse Practitioners) who all work together to provide you with the care you need, when you need it.  We recommend signing up for the patient portal called "MyChart".  Sign up information is provided on this After Visit Summary.  MyChart is used to connect with patients for Virtual Visits (Telemedicine).  Patients are able to view lab/test results, encounter notes, upcoming appointments, etc.  Non-urgent messages can be sent to your provider as well.   To learn more about what you can do with MyChart, go to [email or web address].    Your next appointment:   AS NEEDED  The format for your next appointment:     Provider:   Laurance FlattenHeather Cayton Cuevas, MD   Other Instructions     I,Zite Okoli,acting as a scribe for Meriam SpragueHeather E Aleenah Homen, MD.,have documented all relevant  documentation on the behalf of Meriam SpragueHeather E Vanden Fawaz, MD,as directed by  Meriam SpragueHeather E Keante Urizar, MD while in the presence of Meriam SpragueHeather E Alliene Klugh, MD.   I, Meriam SpragueHeather E Yanilen Adamik, MD, have reviewed all documentation for this visit. The documentation on 09/21/20 for the exam, diagnosis, procedures, and orders are all accurate and complete.   Signed, Meriam SpragueHeather E Glenroy Crossen, MD  09/21/2020 2:44 PM    [facility]

## 2020-09-21 NOTE — Patient Instructions (Addendum)
Medication Instructions:  Your physician recommends that you continue on your current medications as directed. Please refer to the Current Medication list given to you today.  *If you need a refill on your cardiac medications before your next appointment, please call your pharmacy*   Lab Work: None ordered  If you have labs (blood work) drawn today and your tests are completely normal, you will receive your results only by: MyChart Message (if you have MyChart) OR A paper copy in the mail If you have any lab test that is abnormal or we need to change your treatment, we will call you to review the results.   Testing/Procedures: Your physician has requested that you have an exercise tolerance test. For further information please visit [email or web address] Please also follow instruction sheet, as given.  Your physician has requested that you have an echocardiogram. Echocardiography is a painless test that uses sound waves to create images of your heart. It provides your doctor with information about the size and shape of your heart and how well your heart's chambers and valves are working. This procedure takes approximately one hour. There are no restrictions for this procedure.   Follow-Up: At [facility], you and your health needs are our priority.  As part of our continuing mission to provide you with exceptional heart care, we have created designated Provider Care Teams.  These Care Teams include your primary Cardiologist (physician) and Advanced Practice Providers (APPs -  Physician Assistants and Nurse Practitioners) who all work together to provide you with the care you need, when you need it.  We recommend signing up for the patient portal called "MyChart".  Sign up information is provided on this After Visit Summary.  MyChart is used to connect with patients for Virtual Visits (Telemedicine).  Patients are able to view lab/test results, encounter notes, upcoming appointments, etc.   Non-urgent messages can be sent to your provider as well.   To learn more about what you can do with MyChart, go to [email or web address].    Your next appointment:   AS NEEDED  The format for your next appointment:     Provider:   Laurance Flatten, MD   Other Instructions

## 2020-10-08 ENCOUNTER — Ambulatory Visit: Payer: Managed Care, Other (non HMO) | Admitting: Cardiology

## 2020-10-16 ENCOUNTER — Ambulatory Visit (HOSPITAL_COMMUNITY): Payer: Managed Care, Other (non HMO) | Attending: Cardiovascular Disease

## 2020-10-16 ENCOUNTER — Encounter (HOSPITAL_COMMUNITY): Payer: Self-pay | Admitting: Cardiology

## 2020-10-25 ENCOUNTER — Telehealth (HOSPITAL_COMMUNITY): Payer: Self-pay | Admitting: Cardiology

## 2020-10-25 NOTE — Telephone Encounter (Signed)
Just an FYI. We have made several attempts to contact this patient including sending a letter to schedule or reschedule their echocardiogram. We will be removing the patient from the echo WQ.   10/16/20 NO SHOWED-MAILED LETTER [facility]    Thank you

## 2020-10-29 ENCOUNTER — Encounter: Payer: Self-pay | Admitting: Cardiology
